# Patient Record
Sex: Male | Born: 1960 | Race: Black or African American | Hispanic: No | Marital: Single | Smoking: Current every day smoker
Health system: Southern US, Community
[De-identification: ages and names within clinical notes are randomized; demographics above are authoritative.]

## PROBLEM LIST (undated history)

## (undated) DIAGNOSIS — I10 Essential (primary) hypertension: Secondary | ICD-10-CM

## (undated) DIAGNOSIS — G459 Transient cerebral ischemic attack, unspecified: Secondary | ICD-10-CM

## (undated) DIAGNOSIS — H409 Unspecified glaucoma: Secondary | ICD-10-CM

## (undated) DIAGNOSIS — E119 Type 2 diabetes mellitus without complications: Secondary | ICD-10-CM

## (undated) HISTORY — PX: COLON SURGERY: SHX602

## (undated) HISTORY — PX: BACK SURGERY: SHX140

## (undated) HISTORY — PX: IMPLANTATION / PLACEMENT EPIDURAL NEUROSTIMULATOR ELECTRODES: SUR687

---

## 2014-12-20 ENCOUNTER — Encounter (HOSPITAL_COMMUNITY): Payer: Self-pay | Admitting: *Deleted

## 2014-12-20 ENCOUNTER — Emergency Department (HOSPITAL_COMMUNITY): Payer: Medicare Other

## 2014-12-20 ENCOUNTER — Observation Stay (HOSPITAL_COMMUNITY)
Admission: EM | Admit: 2014-12-20 | Discharge: 2014-12-22 | Disposition: A | Discharge status: Home / Self Care | Payer: Medicare Other | Attending: Family Medicine | Admitting: Family Medicine

## 2014-12-20 DIAGNOSIS — I129 Hypertensive chronic kidney disease with stage 1 through stage 4 chronic kidney disease, or unspecified chronic kidney disease: Secondary | ICD-10-CM | POA: Diagnosis not present

## 2014-12-20 DIAGNOSIS — Z79899 Other long term (current) drug therapy: Secondary | ICD-10-CM | POA: Insufficient documentation

## 2014-12-20 DIAGNOSIS — N189 Chronic kidney disease, unspecified: Secondary | ICD-10-CM | POA: Diagnosis not present

## 2014-12-20 DIAGNOSIS — Z791 Long term (current) use of non-steroidal anti-inflammatories (NSAID): Secondary | ICD-10-CM | POA: Insufficient documentation

## 2014-12-20 DIAGNOSIS — R4781 Slurred speech: Secondary | ICD-10-CM | POA: Diagnosis not present

## 2014-12-20 DIAGNOSIS — E119 Type 2 diabetes mellitus without complications: Secondary | ICD-10-CM

## 2014-12-20 DIAGNOSIS — Z6836 Body mass index (BMI) 36.0-36.9, adult: Secondary | ICD-10-CM | POA: Diagnosis not present

## 2014-12-20 DIAGNOSIS — R200 Anesthesia of skin: Secondary | ICD-10-CM | POA: Insufficient documentation

## 2014-12-20 DIAGNOSIS — R531 Weakness: Secondary | ICD-10-CM | POA: Insufficient documentation

## 2014-12-20 DIAGNOSIS — Z7902 Long term (current) use of antithrombotics/antiplatelets: Secondary | ICD-10-CM | POA: Diagnosis not present

## 2014-12-20 DIAGNOSIS — R262 Difficulty in walking, not elsewhere classified: Secondary | ICD-10-CM | POA: Insufficient documentation

## 2014-12-20 DIAGNOSIS — Z8673 Personal history of transient ischemic attack (TIA), and cerebral infarction without residual deficits: Secondary | ICD-10-CM | POA: Insufficient documentation

## 2014-12-20 DIAGNOSIS — E669 Obesity, unspecified: Secondary | ICD-10-CM | POA: Diagnosis not present

## 2014-12-20 DIAGNOSIS — F17200 Nicotine dependence, unspecified, uncomplicated: Secondary | ICD-10-CM | POA: Diagnosis not present

## 2014-12-20 DIAGNOSIS — G459 Transient cerebral ischemic attack, unspecified: Secondary | ICD-10-CM | POA: Diagnosis present

## 2014-12-20 DIAGNOSIS — Z7982 Long term (current) use of aspirin: Secondary | ICD-10-CM | POA: Insufficient documentation

## 2014-12-20 DIAGNOSIS — I10 Essential (primary) hypertension: Secondary | ICD-10-CM | POA: Diagnosis present

## 2014-12-20 DIAGNOSIS — Z7984 Long term (current) use of oral hypoglycemic drugs: Secondary | ICD-10-CM | POA: Diagnosis not present

## 2014-12-20 DIAGNOSIS — E785 Hyperlipidemia, unspecified: Secondary | ICD-10-CM | POA: Insufficient documentation

## 2014-12-20 DIAGNOSIS — H409 Unspecified glaucoma: Secondary | ICD-10-CM | POA: Diagnosis present

## 2014-12-20 DIAGNOSIS — I639 Cerebral infarction, unspecified: Secondary | ICD-10-CM

## 2014-12-20 DIAGNOSIS — G451 Carotid artery syndrome (hemispheric): Secondary | ICD-10-CM | POA: Insufficient documentation

## 2014-12-20 HISTORY — DX: Unspecified glaucoma: H40.9

## 2014-12-20 HISTORY — DX: Essential (primary) hypertension: I10

## 2014-12-20 HISTORY — DX: Type 2 diabetes mellitus without complications: E11.9

## 2014-12-20 HISTORY — DX: Transient cerebral ischemic attack, unspecified: G45.9

## 2014-12-20 LAB — COMPREHENSIVE METABOLIC PANEL
ALBUMIN: 3.7 g/dL (ref 3.5–5.0)
ALK PHOS: 65 U/L (ref 38–126)
ALT: 15 U/L — AB (ref 17–63)
ANION GAP: 12 (ref 5–15)
AST: 21 U/L (ref 15–41)
BILIRUBIN TOTAL: 0.5 mg/dL (ref 0.3–1.2)
BUN: 20 mg/dL (ref 6–20)
CO2: 25 mmol/L (ref 22–32)
CREATININE: 2.59 mg/dL — AB (ref 0.61–1.24)
Calcium: 9.6 mg/dL (ref 8.9–10.3)
Chloride: 97 mmol/L — ABNORMAL LOW (ref 101–111)
GFR calc Af Amer: 31 mL/min — ABNORMAL LOW (ref >60)
GFR calc non Af Amer: 26 mL/min — ABNORMAL LOW (ref >60)
GLUCOSE: 205 mg/dL — AB (ref 65–99)
Potassium: 3.9 mmol/L (ref 3.5–5.1)
SODIUM: 134 mmol/L — AB (ref 135–145)
TOTAL PROTEIN: 7.2 g/dL (ref 6.5–8.1)

## 2014-12-20 LAB — CBC
HCT: 40.9 % (ref 39.0–52.0)
HEMOGLOBIN: 13.9 g/dL (ref 13.0–17.0)
MCH: 30.7 pg (ref 26.0–34.0)
MCHC: 34 g/dL (ref 30.0–36.0)
MCV: 90.3 fL (ref 78.0–100.0)
PLATELETS: 265 10*3/uL (ref 150–400)
RBC: 4.53 MIL/uL (ref 4.22–5.81)
RDW: 12.6 % (ref 11.5–15.5)
WBC: 8.6 10*3/uL (ref 4.0–10.5)

## 2014-12-20 LAB — I-STAT TROPONIN, ED: Troponin i, poc: 0 ng/mL (ref 0.00–0.08)

## 2014-12-20 LAB — I-STAT CHEM 8, ED
BUN: 24 mg/dL — ABNORMAL HIGH (ref 6–20)
Calcium, Ion: 1.23 mmol/L (ref 1.12–1.23)
Chloride: 101 mmol/L (ref 101–111)
Creatinine, Ser: 2.6 mg/dL — ABNORMAL HIGH (ref 0.61–1.24)
Glucose, Bld: 207 mg/dL — ABNORMAL HIGH (ref 65–99)
HCT: 43 % (ref 39.0–52.0)
HEMOGLOBIN: 14.6 g/dL (ref 13.0–17.0)
POTASSIUM: 4 mmol/L (ref 3.5–5.1)
Sodium: 138 mmol/L (ref 135–145)
TCO2: 24 mmol/L (ref 0–100)

## 2014-12-20 LAB — DIFFERENTIAL
Basophils Absolute: 0 10*3/uL (ref 0.0–0.1)
Basophils Relative: 0 %
EOS PCT: 1 %
Eosinophils Absolute: 0 10*3/uL (ref 0.0–0.7)
LYMPHS ABS: 2.1 10*3/uL (ref 0.7–4.0)
LYMPHS PCT: 25 %
Monocytes Absolute: 0.5 10*3/uL (ref 0.1–1.0)
Monocytes Relative: 6 %
NEUTROS ABS: 5.9 10*3/uL (ref 1.7–7.7)
NEUTROS PCT: 68 %

## 2014-12-20 LAB — PROTIME-INR
INR: 1.06 (ref 0.00–1.49)
Prothrombin Time: 14 seconds (ref 11.6–15.2)

## 2014-12-20 LAB — CBG MONITORING, ED: GLUCOSE-CAPILLARY: 200 mg/dL — AB (ref 65–99)

## 2014-12-20 LAB — APTT: aPTT: 30 seconds (ref 24–37)

## 2014-12-20 MED ORDER — SODIUM CHLORIDE 0.9 % IV BOLUS (SEPSIS)
1000.0000 mL | Freq: Once | INTRAVENOUS | Status: AC
  Administered 2014-12-21: 1000 mL via INTRAVENOUS

## 2014-12-20 NOTE — ED Provider Notes (Signed)
CSN: [id number]     Arrival date & time 12/20/14  2150 History  By signing my name below, I, Wayne Lynn, attest that this documentation has been prepared under the direction and in the presence of [name], MD. Electronically Signed: Doreatha Lynn, ED Scribe. 12/20/2014. 11:42 PM.    Chief Complaint  Patient presents with  . Transient Ischemic Attack   The history is provided by the patient. No language interpreter was used.    HPI Comments: Wayne Lynn is a 54 y.o. male with h/o HTN, HLD, DM, TIA who presents to the Emergency Department complaining of two episodes of stroke-like symptoms, first occurring this afternoon at 1645, lasting 10 minutes, and recurring 2-3 hours later, lasting 30 minutes. He states during the first episode, he experienced right arm weakness and fell to the floor while sitting in a chair at rest. Family member reports that he also had right-sided facial droop, decreased strength in the right arm and inability to raise his right arm. He states complete resolution of the symptoms after 10 minutes had passed. The next event occurred 2-3 hours later with right-sided facial droop, right-sided arm and leg weakness. Family states that he also had slurred speech at this time. Pt states complete resolution of these symptoms after 30 minutes. He states h/o 2 prior TIA (last on 08/25/14), within a week of each other and pt was put on Plavix and ASA. He states no blockage was found in either work up. He endorses that he did not have complete resolution of prior right-sided facial symptoms, but has no residual weakness in his extremities. Pt is a current smoker. He has been taking his medications as prescribed. PCP is in [place] and he is also seen at the [facility] in [place].   Past Medical History  Diagnosis Date  . Hypertension   . Diabetes mellitus without complication (HCC)   . TIA (transient ischemic attack)   . Glaucoma    Past Surgical History  Procedure Laterality  Date  . Colon surgery    . Back surgery    . Implantation / placement epidural neurostimulator electrodes     History reviewed. No pertinent family history. Social History  Substance Use Topics  . Smoking status: Current Every Day Smoker  . Smokeless tobacco: None  . Alcohol Use: Yes    Review of Systems A complete 10 system review of systems was obtained and all systems are negative except as noted in the HPI and PMH.     Allergies  Review of patient's allergies indicates no known allergies.  Home Medications   Prior to Admission medications   Medication Sig Start Date End Date Taking? Authorizing Provider  amLODipine (NORVASC) 10 MG tablet Take 10 mg by mouth daily.   Yes Historical Provider, MD  aspirin EC 81 MG tablet Take 81 mg by mouth daily.   Yes Historical Provider, MD  brimonidine (ALPHAGAN) 0.2 % ophthalmic solution Place 1 drop into both eyes daily.   Yes Historical Provider, MD  clopidogrel (PLAVIX) 75 MG tablet Take 75 mg by mouth daily.   Yes Historical Provider, MD  dorzolamide-timolol (COSOPT) 22.3-6.8 MG/ML ophthalmic solution Place 2 drops into both eyes 2 (two) times daily.    Yes Historical Provider, MD  ibuprofen (ADVIL,MOTRIN) 200 MG tablet Take 200-400 mg by mouth every 6 (six) hours as needed for moderate pain.   Yes Historical Provider, MD  latanoprost (XALATAN) 0.005 % ophthalmic solution Place 1 drop into both eyes at bedtime.  Yes Historical Provider, MD  lisinopril-hydrochlorothiazide (PRINZIDE,ZESTORETIC) 20-25 MG tablet Take 1 tablet by mouth daily.   Yes Historical Provider, MD  metFORMIN (GLUCOPHAGE) 500 MG tablet Take 500 mg by mouth daily.   Yes Historical Provider, MD  metoprolol tartrate (LOPRESSOR) 25 MG tablet Take 25 mg by mouth 2 (two) times daily.   Yes Historical Provider, MD   BP 104/59 mmHg  Pulse 78  Temp(Src) 97.7 F (36.5 C) (Oral)  Resp 16  SpO2 95% Physical Exam  Constitutional: He is oriented to person, place, and time. He  appears well-developed and well-nourished.  HENT:  Head: Normocephalic and atraumatic.  Eyes: EOM are normal. Pupils are equal, round, and reactive to light.  Neck: Normal range of motion.  Cardiovascular: Normal rate, regular rhythm, normal heart sounds and intact distal pulses.   Pulmonary/Chest: Effort normal and breath sounds normal. No respiratory distress.  Lungs CTA bilaterally.   Abdominal: Soft. Bowel sounds are normal. He exhibits no distension. There is no tenderness.  Musculoskeletal: Normal range of motion.  Neurological: He is alert and oriented to person, place, and time.  5/5 strength in major muscle groups of  bilateral upper and lower extremities. Speech normal. No facial asymetry.   Skin: Skin is warm and dry.  Psychiatric: He has a normal mood and affect. Judgment normal.  Nursing note and vitals reviewed.  ED Course  Procedures (including critical care time) DIAGNOSTIC STUDIES: Oxygen Saturation is 95% on RA, adequate by my interpretation.    COORDINATION OF CARE: 11:32 PM Discussed treatment plan with pt at bedside and pt agreed to plan.   Labs Review Labs Reviewed  COMPREHENSIVE METABOLIC PANEL - Abnormal; Notable for the following:    Sodium 134 (*)    Chloride 97 (*)    Glucose, Bld 205 (*)    Creatinine, Ser 2.59 (*)    ALT 15 (*)    GFR calc non Af Amer 26 (*)    GFR calc Af Amer 31 (*)    All other components within normal limits  CBG MONITORING, ED - Abnormal; Notable for the following:    Glucose-Capillary 200 (*)    All other components within normal limits  I-STAT CHEM 8, ED - Abnormal; Notable for the following:    BUN 24 (*)    Creatinine, Ser 2.60 (*)    Glucose, Bld 207 (*)    All other components within normal limits  PROTIME-INR  APTT  CBC  DIFFERENTIAL  I-STAT TROPOININ, ED    Imaging Review Ct Head Wo Contrast  12/21/2014  CLINICAL DATA:  Right-sided numbness, symptoms have resolved. Dizziness. EXAM: CT HEAD WITHOUT CONTRAST  TECHNIQUE: Contiguous axial images were obtained from the base of the skull through the vertex without intravenous contrast. COMPARISON:  None. 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 Small lacunar infarct left thalamus. No evidence of territorial infarct. No intracranial hemorrhage, mass effect, or midline shift. No hydrocephalus. The basilar cisterns are patent. No intracranial fluid collection. Calvarium is intact. Included paranasal sinuses and mastoid air cells are well aerated. Postsurgical change in the right globe. 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 These are likely remote, however no prior exams are available for comparison. No evidence of territorial infarct. Electronically Signed   By: Rubye OaksMelanie  Ehinger M.D.   On: 12/21/2014 00:14   I have personally reviewed and evaluated these images and lab results as part of my medical decision-making.   EKG  Interpretation   Date/Time:  Sunday December 20 2014 22:00:39 EDT Ventricular Rate:  77 PR Interval:  168 QRS Duration: 83 QT Interval:  379 QTC Calculation: 429 R Axis:   29 Text Interpretation:  Sinus rhythm Borderline T wave abnormalities No old  tracing to compare Confirmed by BELFI  MD, MELANIE ([id number]) on 12/20/2014  10:05:29 PM      MDM   Final diagnoses:  Transient cerebral ischemia, unspecified transient cerebral ischemia type   Symptoms consistent with TIA. Already on plavix. Recent workup in June 2016. Given two episodes today and prominent debilitating features the pt will be placed on observation. Spoke with Dr Thad Ranger of neurology who will evaluate the pt. Agrees with observational admission  I, Cyana Shook M, personally performed the services described in this documentation. All medical record entries made by the scribe were at my direction and in my presence.  I have reviewed the chart and discharge instructions and agree that the record  reflects my personal performance and is accurate and complete. Samad Thon M.  12/21/2014. 10:56 PM.       [name], MD 12/21/14 2256

## 2014-12-20 NOTE — ED Notes (Signed)
Pt refusing IV 

## 2014-12-20 NOTE — ED Notes (Signed)
CBG 200 

## 2014-12-20 NOTE — ED Notes (Signed)
Pt to ED from home via [facility] c/o stroke-like symptoms. Hx of TIAs with last one in June. Pt was started on Plavix at that time. 2 hours pta pt c/o numbness to R arm and R side of face. First episode lasting 10mins then subsided; second episode lasting 30 mins then subsided. At present pt denies symtpoms.

## 2014-12-21 ENCOUNTER — Ambulatory Visit (HOSPITAL_COMMUNITY): Payer: Medicare Other

## 2014-12-21 DIAGNOSIS — E119 Type 2 diabetes mellitus without complications: Secondary | ICD-10-CM | POA: Diagnosis not present

## 2014-12-21 DIAGNOSIS — I10 Essential (primary) hypertension: Secondary | ICD-10-CM

## 2014-12-21 DIAGNOSIS — G459 Transient cerebral ischemic attack, unspecified: Secondary | ICD-10-CM

## 2014-12-21 DIAGNOSIS — G451 Carotid artery syndrome (hemispheric): Secondary | ICD-10-CM | POA: Insufficient documentation

## 2014-12-21 DIAGNOSIS — H409 Unspecified glaucoma: Secondary | ICD-10-CM | POA: Diagnosis present

## 2014-12-21 LAB — LIPID PANEL
CHOL/HDL RATIO: 7.6 ratio
CHOLESTEROL: 244 mg/dL — AB (ref 0–200)
HDL: 32 mg/dL — AB (ref >40)
LDL CALC: 164 mg/dL — AB (ref 0–99)
TRIGLYCERIDES: 242 mg/dL — AB (ref <150)
VLDL: 48 mg/dL — AB (ref 0–40)

## 2014-12-21 LAB — GLUCOSE, CAPILLARY: Glucose-Capillary: 144 mg/dL — ABNORMAL HIGH (ref 65–99)

## 2014-12-21 MED ORDER — PRAVASTATIN SODIUM 40 MG PO TABS: 40.0000 mg | ORAL_TABLET | Freq: Every day | ORAL | Status: DC

## 2014-12-21 MED ORDER — ASPIRIN-DIPYRIDAMOLE ER 25-200 MG PO CP12: 1.0000 | ORAL_CAPSULE | Freq: Two times a day (BID) | ORAL | Status: DC

## 2014-12-21 MED ORDER — PRAVASTATIN SODIUM 40 MG PO TABS
80.0000 mg | ORAL_TABLET | Freq: Every day | ORAL | Status: DC
  Administered 2014-12-21: 80 mg via ORAL
  Filled 2014-12-21: qty 2

## 2014-12-21 MED ORDER — METOPROLOL TARTRATE 25 MG PO TABS
25.0000 mg | ORAL_TABLET | Freq: Two times a day (BID) | ORAL | Status: DC
  Administered 2014-12-21 – 2014-12-22 (×3): 25 mg via ORAL
  Filled 2014-12-21 (×3): qty 1

## 2014-12-21 MED ORDER — LATANOPROST 0.005 % OP SOLN
1.0000 [drp] | Freq: Every day | OPHTHALMIC | Status: DC
  Administered 2014-12-21: 1 [drp] via OPHTHALMIC
  Filled 2014-12-21: qty 2.5

## 2014-12-21 MED ORDER — LISINOPRIL-HYDROCHLOROTHIAZIDE 20-25 MG PO TABS: 1.0000 | ORAL_TABLET | Freq: Every day | ORAL | Status: DC

## 2014-12-21 MED ORDER — AMLODIPINE BESYLATE 10 MG PO TABS
10.0000 mg | ORAL_TABLET | Freq: Every day | ORAL | Status: DC
  Administered 2014-12-21 – 2014-12-22 (×2): 10 mg via ORAL
  Filled 2014-12-21 (×2): qty 1

## 2014-12-21 MED ORDER — STROKE: EARLY STAGES OF RECOVERY BOOK: Freq: Once | Status: DC

## 2014-12-21 MED ORDER — ASPIRIN 325 MG PO TABS
325.0000 mg | ORAL_TABLET | Freq: Every day | ORAL | Status: DC
  Administered 2014-12-21: 325 mg via ORAL
  Filled 2014-12-21: qty 1

## 2014-12-21 MED ORDER — DORZOLAMIDE HCL-TIMOLOL MAL 2-0.5 % OP SOLN
2.0000 [drp] | Freq: Two times a day (BID) | OPHTHALMIC | Status: DC
  Administered 2014-12-21 – 2014-12-22 (×3): 2 [drp] via OPHTHALMIC
  Filled 2014-12-21: qty 10

## 2014-12-21 MED ORDER — ACETAMINOPHEN 325 MG PO TABS
650.0000 mg | ORAL_TABLET | Freq: Every day | ORAL | Status: DC
  Administered 2014-12-21: 650 mg via ORAL
  Filled 2014-12-21: qty 2

## 2014-12-21 MED ORDER — HYDROCHLOROTHIAZIDE 25 MG PO TABS
25.0000 mg | ORAL_TABLET | Freq: Every day | ORAL | Status: DC
Start: 2014-12-21 — End: 2014-12-21
  Administered 2014-12-21: 25 mg via ORAL
  Filled 2014-12-21: qty 1

## 2014-12-21 MED ORDER — SODIUM CHLORIDE 0.9 % IV SOLN: 250.0000 mL | INTRAVENOUS | Status: DC | PRN

## 2014-12-21 MED ORDER — LISINOPRIL 10 MG PO TABS
10.0000 mg | ORAL_TABLET | Freq: Every day | ORAL | Status: DC
  Administered 2014-12-22: 10 mg via ORAL
  Filled 2014-12-21: qty 1

## 2014-12-21 MED ORDER — SENNOSIDES-DOCUSATE SODIUM 8.6-50 MG PO TABS
1.0000 | ORAL_TABLET | Freq: Every evening | ORAL | Status: DC | PRN
Start: 2014-12-21 — End: 2014-12-22

## 2014-12-21 MED ORDER — ASPIRIN 300 MG RE SUPP: 300.0000 mg | Freq: Every day | RECTAL | Status: DC

## 2014-12-21 MED ORDER — CLOPIDOGREL BISULFATE 75 MG PO TABS
75.0000 mg | ORAL_TABLET | Freq: Every day | ORAL | Status: DC
  Administered 2014-12-21: 75 mg via ORAL
  Filled 2014-12-21: qty 1

## 2014-12-21 MED ORDER — BRIMONIDINE TARTRATE 0.2 % OP SOLN
1.0000 [drp] | Freq: Every day | OPHTHALMIC | Status: DC
  Administered 2014-12-21 – 2014-12-22 (×2): 1 [drp] via OPHTHALMIC
  Filled 2014-12-21: qty 5

## 2014-12-21 MED ORDER — ENOXAPARIN SODIUM 40 MG/0.4ML ~~LOC~~ SOLN
40.0000 mg | SUBCUTANEOUS | Status: DC
  Administered 2014-12-21 – 2014-12-22 (×2): 40 mg via SUBCUTANEOUS
  Filled 2014-12-21 (×2): qty 0.4

## 2014-12-21 MED ORDER — LISINOPRIL 20 MG PO TABS
20.0000 mg | ORAL_TABLET | Freq: Every day | ORAL | Status: DC
  Administered 2014-12-21: 20 mg via ORAL
  Filled 2014-12-21: qty 1

## 2014-12-21 MED ORDER — SODIUM CHLORIDE 0.9 % IJ SOLN: 3.0000 mL | Freq: Two times a day (BID) | INTRAMUSCULAR | Status: DC

## 2014-12-21 MED ORDER — SODIUM CHLORIDE 0.9 % IJ SOLN: 3.0000 mL | INTRAMUSCULAR | Status: DC | PRN

## 2014-12-21 MED ORDER — ASPIRIN-DIPYRIDAMOLE ER 25-200 MG PO CP12
1.0000 | ORAL_CAPSULE | Freq: Every day | ORAL | Status: DC
  Administered 2014-12-21: 1 via ORAL
  Filled 2014-12-21: qty 1

## 2014-12-21 NOTE — Progress Notes (Signed)
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-note that this is not compatible with MRI  Admitted to the hospital earlier today with slurred speech facial numbness which has spontaneously resolved  On exam no deficit no further issues Power 5/5, reflexes 2/3, Speech is clear external ocular movements are intact Sensory deferred   Note that he has creatinine 2.5 we are still awaiting records however in the interim I would discontinue his HCTZ and drop his dose of lisinopril from 20-10 Metformin is relatively contraindicated given renal insufficiency He states compliance on his other medications We will await further workup including echo Carotids have not been ordered as where waiting Follow A1c, follow lipid panel  Neurology confirms CT scan to be done tomorrow morning , not today as 24-hour should lapse between CT scans to denote any further change   Pleas KochJai Bless Belshe, MD [facility] [phone]) [phone]

## 2014-12-21 NOTE — Progress Notes (Signed)
PT Cancellation Note  Patient Details Name: August AlbinoLemuel E Duchene MRN: [id number] DOB: March 21, 1960   Cancelled Treatment:    Reason Eval/Treat Not Completed: Noted order to begin 12/22/14   Noted pt on observation, second head CT pending. Will follow-up later today if MD updates order to be completed today 10/17.   Joseangel Nettleton 12/21/2014, 8:23 AM  Pager [phone]

## 2014-12-21 NOTE — Evaluation (Signed)
Physical Therapy Evaluation and Discharge Patient Details Name: Wayne Lynn MRN: [id number] DOB: September 25, 1960 Today's Date: 12/21/2014   History of Present Illness  Presented to ED after 2 episodes of Rt sided weakness and numbness (each resolved after minutes). CT head + bil lacunar infarcts of basal ganglia (age indeterminate) PMHx- multiple TIAs, DM    Clinical Impression  Patient evaluated by Physical Therapy with no further acute PT needs identified. At baseline, pt has an antalgic gait pattern with incr lateral flexion of torso. Patient with no indication of balance deficits.  PT is signing off. Thank you for this referral.     Follow Up Recommendations No PT follow up    Equipment Recommendations  None recommended by PT    Recommendations for Other Services       Precautions / Restrictions Precautions Precautions: None      Mobility  Bed Mobility                  Transfers Overall transfer level: Independent Equipment used: None             General transfer comment: multiple repetitions  Ambulation/Gait Ambulation/Gait assistance: Modified independent (Device/Increase time) Ambulation Distance (Feet): 500 Feet Assistive device: None Gait Pattern/deviations: Step-through pattern;Decreased stride length;Wide base of support (incr lateral shift of shoulders) Gait velocity: functional Gait velocity interpretation: Below normal speed for age/gender General Gait Details: no cane available and pt states "no problem, I can walk without it" When asked why he uses cane he reports due to decr balance (although earlier denied any problems with balance);   Stairs Stairs: Yes Stairs assistance: Modified independent (Device/Increase time) Stair Management: One rail Right;One rail Left;Alternating pattern;Forwards Number of Stairs: 5    Wheelchair Mobility    Modified Rankin (Stroke Patients Only) Modified Rankin (Stroke Patients Only) Pre-Morbid  Rankin Score: No symptoms Modified Rankin: No symptoms     Balance Overall balance assessment: Modified Independent                               Standardized Balance Assessment Standardized Balance Assessment : Dynamic Gait Index   Dynamic Gait Index Level Surface: Mild Impairment Change in Gait Speed: Mild Impairment Gait with Horizontal Head Turns: Normal Gait with Vertical Head Turns: Normal Gait and Pivot Turn: Normal Step Over Obstacle: Mild Impairment Step Around Obstacles: Normal Steps: Mild Impairment Total Score: 20       Pertinent Vitals/Pain Pain Assessment: No/denies pain    Home Living Family/patient expects to be discharged to:: Private residence Living Arrangements: Spouse/significant other Available Help at Discharge: Family Type of Home: House Home Access: Stairs to enter Entrance Stairs-Rails: Doctor, general practice of Steps: 3 Home Layout: Able to live on main level with bedroom/bathroom Home Equipment: Walker - 2 wheels;Cane - single point;Shower seat - built in;Grab bars - tub/shower;Wheelchair - Careers adviser (comment) (uses golf cart)      Prior Function Level of Independence: Independent with assistive device(s)         Comments: full disability from [employer]; likes golf     Hand Dominance        Extremity/Trunk Assessment   Upper Extremity Assessment: Overall WFL for tasks assessed;Defer to OT evaluation           Lower Extremity Assessment: Overall WFL for tasks assessed      Cervical / Trunk Assessment: Normal  Communication   Communication: No difficulties  Cognition Arousal/Alertness: Awake/alert  Behavior During Therapy: WFL for tasks assessed/performed Overall Cognitive Status: Within Functional Limits for tasks assessed                      General Comments      Exercises        Assessment/Plan    PT Assessment Patent does not need any further PT services  PT Diagnosis  Difficulty walking   PT Problem List    PT Treatment Interventions     PT Goals (Current goals can be found in the Care Plan section) Acute Rehab PT Goals PT Goal Formulation: All assessment and education complete, DC therapy    Frequency     Barriers to discharge        Co-evaluation               End of Session   Activity Tolerance: Patient tolerated treatment well Patient left: in chair;with call bell/phone within reach;with family/visitor present Nurse Communication: Mobility status;Other (comment) (transfer sheet sign-off started)    Functional Assessment Tool Used: clinical judgment Functional Limitation: Mobility: Walking and moving around Mobility: Walking and Moving Around Current Status [phone](G8978): At least 1 percent but less than 20 percent impaired, limited or restricted Mobility: Walking and Moving Around Goal Status [phone](G8979): At least 1 percent but less than 20 percent impaired, limited or restricted Mobility: Walking and Moving Around Discharge Status [phone](G8980): At least 1 percent but less than 20 percent impaired, limited or restricted    Time: 1244-1257 PT Time Calculation (min) (ACUTE ONLY): 13 min   Charges:   PT Evaluation $Initial PT Evaluation Tier I: 1 Procedure     PT G Codes:   PT G-Codes **NOT FOR INPATIENT CLASS** Functional Assessment Tool Used: clinical judgment Functional Limitation: Mobility: Walking and moving around Mobility: Walking and Moving Around Current Status ([phone]): At least 1 percent but less than 20 percent impaired, limited or restricted Mobility: Walking and Moving Around Goal Status [phone](G8979): At least 1 percent but less than 20 percent impaired, limited or restricted Mobility: Walking and Moving Around Discharge Status [phone](G8980): At least 1 percent but less than 20 percent impaired, limited or restricted    Tamikka Pilger 12/21/2014, 1:10 PM Pager [phone]

## 2014-12-21 NOTE — H&P (Signed)
[facility] Admission History and Physical       Wayne AlbinoLemuel E Codrington JYN:[id number] DOB: 1960/12/03 DOA: 12/20/2014  Referring physician: EDP PCP: No primary care provider on file.  Specialists:   Chief Complaint: Right Arm then Right Sided Weakness  HPI: Wayne Lynn is a 54 y.o. male with a history of TIAs, HTN, DM2 and Glaucoma who presents to the ED with complaints of 2 episodes of TIA symptoms today.  The first episode was with right arm weakness that lasted for about 20 minutes, then later in the day he had another episode of right sided weakness with slurring of his speech and facial numbness that lasted about 30 minutes.   He denied having any headache or dizziness.    He was evaluated in the ED and had a Ct scan of the Head which was negative for acute findings.   Neurology was consulted and he was seen by Dr. Thad Rangereynolds in the ED.     Of note he had TIA symptoms and [place] work[place]  The Medical records have been requested to review the previous workups.    Also Patient has a Spinal cord stimulator and can not undergo MRI studies, so a repeat CT scan of teh Head will be performed in the AM.     Review of Systems:  Constitutional: No Weight Loss, No Weight Gain, Night Sweats, Fevers, Chills, Dizziness, Light Headedness, Fatigue, or Generalized Weakness HEENT: No Headaches, Difficulty Swallowing,Tooth/Dental Problems,Sore Throat,  No Sneezing, Rhinitis, Ear Ache, Nasal Congestion, or Post Nasal Drip,  Cardio-vascular:  No Chest pain, Orthopnea, PND, Edema in Lower Extremities, Anasarca, Dizziness, Palpitations  Resp: No Dyspnea, No DOE, No Productive Cough, No Non-Productive Cough, No Hemoptysis, No Wheezing.    GI: No Heartburn, Indigestion, Abdominal Pain, Nausea, Vomiting, Diarrhea, Constipation, Hematemesis, Hematochezia, Melena, Change in Bowel Habits,  Loss of Appetite  GU: No  Dysuria, No Change in Color of Urine, No Urgency or Urinary Frequency, No Flank pain.  Musculoskeletal: No Joint Pain or Swelling, No Decreased Range of Motion, No Back Pain.  Neurologic: No Syncope, No Seizures, +Right Arm and Right Sided Weakness, Paresthesia, Vision Disturbance or Loss, No Diplopia, No Vertigo, No Difficulty Walking,  Skin: No Rash or Lesions. Psych: No Change in Mood or Affect, No Depression or Anxiety, No Memory loss, No Confusion, or Hallucinations   Past Medical History  Diagnosis Date  . Hypertension   . Diabetes mellitus without complication (HCC)   . TIA (transient ischemic attack)   . Glaucoma      Past Surgical History  Procedure Laterality Date  . Colon surgery    . Back surgery    . Implantation / placement epidural neurostimulator electrodes        Prior to Admission medications   Medication Sig Start Date End Date Taking? Authorizing Provider  amLODipine (NORVASC) 10 MG tablet Take 10 mg by mouth daily.   Yes Historical Provider, MD  aspirin EC 81 MG tablet Take 81 mg by mouth daily.   Yes Historical Provider, MD  brimonidine (ALPHAGAN) 0.2 % ophthalmic solution Place 1 drop into both eyes daily.   Yes Historical Provider, MD  clopidogrel (PLAVIX) 75 MG tablet Take 75 mg by mouth daily.   Yes Historical Provider, MD  dorzolamide-timolol (COSOPT) 22.3-6.8 MG/ML ophthalmic solution Place 2 drops into both eyes 2 (two) times daily.    Yes  Historical Provider, MD  ibuprofen (ADVIL,MOTRIN) 200 MG tablet Take 200-400 mg by mouth every 6 (six) hours as needed for moderate pain.   Yes Historical Provider, MD  latanoprost (XALATAN) 0.005 % ophthalmic solution Place 1 drop into both eyes at bedtime.   Yes Historical Provider, MD  lisinopril-hydrochlorothiazide (PRINZIDE,ZESTORETIC) 20-25 MG tablet Take 1 tablet by mouth daily.   Yes Historical Provider, MD  metFORMIN (GLUCOPHAGE) 500 MG tablet Take 500 mg by mouth daily.   Yes Historical Provider, MD    metoprolol tartrate (LOPRESSOR) 25 MG tablet Take 25 mg by mouth 2 (two) times daily.   Yes Historical Provider, MD     No Known Allergies    Social History:  reports that he has been smoking.  He does not have any smokeless tobacco history on file. He reports that he drinks alcohol. His drug history is not on file.     History reviewed. No pertinent family history.     Physical Exam:  GEN:  Pleasant Obese 54 y.o. African American male examined and in no acute distress; cooperative with exam Filed Vitals:   12/20/14 2345 12/21/14 0000 12/21/14 0015 12/21/14 0030  BP: 122/79  119/63 122/72  Pulse: 77 76 71 70  Temp:      TempSrc:      Resp: SpO2: 97% 95% 95% 92%   Blood pressure 122/72, pulse 70, temperature 97.7 F (36.5 C), temperature source Oral, resp. rate 18, SpO2 92 %. PSYCH: He is alert and oriented x4; does not appear anxious does not appear depressed; affect is normal HEENT: Normocephalic and Atraumatic, Mucous membranes pink; PERRLA; EOM intact; Fundi:  Benign;  No scleral icterus, Nares: Patent, Oropharynx: Clear, Fair Dentition,    Neck:  FROM, No Cervical Lymphadenopathy nor Thyromegaly or Carotid Bruit; No JVD; Breasts:: Not examined CHEST WALL: No tenderness CHEST: Normal respiration, clear to auscultation bilaterally HEART: Regular rate and rhythm; no murmurs rubs or gallops BACK: No kyphosis or scoliosis; No CVA tenderness ABDOMEN: Positive Bowel Sounds, Obese, Soft Non-Tender, No Rebound or Guarding; No Masses, No Organomegaly. Rectal Exam: Not done EXTREMITIES: No Cyanosis, Clubbing, or Edema; No Ulcerations. Genitalia: not examined PULSES: 2+ and symmetric SKIN: Normal hydration no rash or ulceration CNS:  Alert and Oriented x 4, No Focal Deficits Mental Status:  Alert, Oriented, Thought Content Appropriate. Speech Fluent without evidence of Aphasia. Able to follow 3 step commands without difficulty.  In No obvious pain.   Cranial Nerves:   II: Discs flat bilaterally; Visual fields Intact, Pupils equal and reactive.    III,IV, VI: Extra-ocular motions intact bilaterally    V,VII: smile symmetric, facial light touch sensation normal bilaterally    VIII: hearing intact bilaterally    IX,X: gag reflex present    XI: bilateral shoulder shrug    XII: midline tongue extension   Motor:  Right:  Upper extremity 5/5     Left:  Upper extremity 5/5     Right:  Lower extremity 5/5    Left:  Lower extremity 5/5     Tone and Bulk:  normal tone throughout; no atrophy noted   Sensory:  Pinprick and light touch intact throughout, bilaterally   Deep Tendon Reflexes: 2+ and symmetric throughout   Plantars/ Babinski:  Right: normal Left: normal    Cerebellar:  Finger to nose without difficulty.   Gait: deferred    Vascular: pulses palpable throughout    Labs on Admission:  Basic Metabolic Panel:  Recent Labs Lab 12/20/14 2210 12/20/14 2223  NA 134* 138  K 3.9 4.0  CL 97* 101  CO2 25  --   GLUCOSE 205* 207*  BUN 20 24*  CREATININE 2.59* 2.60*  CALCIUM 9.6  --    Liver Function Tests:  Recent Labs Lab 12/20/14 2210  AST 21  ALT 15*  ALKPHOS 65  BILITOT 0.5  PROT 7.2  ALBUMIN 3.7   No results for input(s): LIPASE, AMYLASE in the last 168 hours. No results for input(s): AMMONIA in the last 168 hours. CBC:  Recent Labs Lab 12/20/14 2210 12/20/14 2223  WBC 8.6  --   NEUTROABS 5.9  --   HGB 13.9 14.6  HCT 40.9 43.0  MCV 90.3  --   PLT 265  --    Cardiac Enzymes: No results for input(s): CKTOTAL, CKMB, CKMBINDEX, TROPONINI in the last 168 hours.  BNP (last 3 results) No results for input(s): BNP in the last 8760 hours.  ProBNP (last 3 results) No results for input(s): PROBNP in the last 8760 hours.  CBG:  Recent Labs Lab 12/20/14 2208  GLUCAP 200*    Radiological Exams on Admission: Ct Head Wo Contrast  12/21/2014  CLINICAL DATA:  Right-sided numbness, symptoms have resolved. Dizziness.  EXAM: CT HEAD WITHOUT CONTRAST TECHNIQUE: Contiguous axial images were obtained from the base of the skull through the vertex without intravenous contrast. COMPARISON:  None. 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 Small lacunar infarct left thalamus. No evidence of territorial infarct. No intracranial hemorrhage, mass effect, or midline shift. No hydrocephalus. The basilar cisterns are patent. No intracranial fluid collection. Calvarium is intact. Included paranasal sinuses and mastoid air cells are well aerated. Postsurgical change in the right globe. 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 These are likely remote, however no prior exams are available for comparison. No evidence of territorial infarct. Electronically Signed   By: Rubye Oaks M.D.   On: 12/21/2014 00:14     EKG: Independently reviewed. Normal Sinus Rhythm Rate =77  No Acute Changes   Assessment/Plan:     54 y.o. male with  Principal Problem:   1.     TIA (transient ischemic attack)   Cardiac Monitoring   Neuro Checks   Repeat Ct Scan of Head in AM   2D ECHO in AM    Request records from previous TIA workup, Carotid US not ordered due to probable recent one done within 4-6 months   ASA Rx and Plavix     Active Problems:   2.    Hypertension   Continue Metoprolol, Amlodipine, and Lisinopril/HCTZ   Monitor BPs     3.    Diabetes mellitus without complication (HCC)   Hold Metformin Rx   SSI Coverage PRN   Check HbA1C in AM     4.    Glaucoma   Continue Xalatan and Cosopt Ophthalmic Drops     5.    DVT Prophylaxis   Lovenox   Code Status:     FULL CODE        Family Communication:   Family at Bedside     Disposition Plan:     Observation Status        Time spent:  24 Minutes      Ron Parker [facility] Pager [phone]   If 7AM -7PM Please Contact the Day Rounding Team MD for [facility]  If  7PM-7AM, Please  Contact Night-Floor Coverage  [email or web address] Password [email or web address] 12/21/2014, 12:48 AM     ADDENDUM:   Patient was seen and examined on 12/21/2014

## 2014-12-21 NOTE — Consult Note (Signed)
Referring Physician: Patria Mane    Chief Complaint: Right sided weakness, difficulty with speech  HPI: Wayne Lynn is an 54 y.o. male who reports that he was watching television (about 1630) this afternoon and had the acute onset of weakness in the RUE.  Symptoms lasted about 15 minutes and resolved spontaneously.  About three hours later he had the onset of weakness and numbness along the entire right side including the face.  Patient was unable to get his words out as well.  Symptoms lasted for about 30 minutes and resolved spontaneously.  Patient was concerned at that time and presented for evaluation. Patient had two episodes of TIA this summer about one week apart.  Was hospitalized for both.  Patient has been maintained on ASA and Plavix and reports compliance with both.    Date last known well: Date: 12/20/2014 Time last known well: Time: 19:30 tPA Given: No: Resolution of symptoms  Past Medical History  Diagnosis Date  . Hypertension   . Diabetes mellitus without complication (HCC)   . TIA (transient ischemic attack)   . Glaucoma     Past Surgical History  Procedure Laterality Date  . Colon surgery    . Back surgery    . Implantation / placement epidural neurostimulator electrodes      Family history: Mother died of stroke.  Unknown what father died from.    Social History:  reports that he has been smoking.  He does not have any smokeless tobacco history on file. He reports that he drinks alcohol. His drug history is not on file.  Allergies: No Known Allergies  Medications: I have reviewed the patient's current medications. Prior to Admission:  Prior to Admission medications   Medication Sig Start Date End Date Taking? Authorizing Provider  amLODipine (NORVASC) 10 MG tablet Take 10 mg by mouth daily.   Yes Historical Provider, MD  aspirin EC 81 MG tablet Take 81 mg by mouth daily.   Yes Historical Provider, MD  brimonidine (ALPHAGAN) 0.2 % ophthalmic solution Place 1  drop into both eyes daily.   Yes Historical Provider, MD  clopidogrel (PLAVIX) 75 MG tablet Take 75 mg by mouth daily.   Yes Historical Provider, MD  dorzolamide-timolol (COSOPT) 22.3-6.8 MG/ML ophthalmic solution Place 2 drops into both eyes 2 (two) times daily.    Yes Historical Provider, MD  ibuprofen (ADVIL,MOTRIN) 200 MG tablet Take 200-400 mg by mouth every 6 (six) hours as needed for moderate pain.   Yes Historical Provider, MD  latanoprost (XALATAN) 0.005 % ophthalmic solution Place 1 drop into both eyes at bedtime.   Yes Historical Provider, MD  lisinopril-hydrochlorothiazide (PRINZIDE,ZESTORETIC) 20-25 MG tablet Take 1 tablet by mouth daily.   Yes Historical Provider, MD  metFORMIN (GLUCOPHAGE) 500 MG tablet Take 500 mg by mouth daily.   Yes Historical Provider, MD  metoprolol tartrate (LOPRESSOR) 25 MG tablet Take 25 mg by mouth 2 (two) times daily.   Yes Historical Provider, MD    ROS: History obtained from the patient  General ROS: negative for - chills, fatigue, fever, night sweats, weight gain or weight loss Psychological ROS: negative for - behavioral disorder, hallucinations, memory difficulties, mood swings or suicidal ideation Ophthalmic ROS: negative for - blurry vision, double vision, eye pain or loss of vision ENT ROS: negative for - epistaxis, nasal discharge, oral lesions, sore throat, tinnitus or vertigo Allergy and Immunology ROS: negative for - hives or itchy/watery eyes Hematological and Lymphatic ROS: negative for - bleeding problems, bruising  or swollen lymph nodes Endocrine ROS: negative for - galactorrhea, hair pattern changes, polydipsia/polyuria or temperature intolerance Respiratory ROS: negative for - cough, hemoptysis, shortness of breath or wheezing Cardiovascular ROS: negative for - chest pain, dyspnea on exertion, edema or irregular heartbeat Gastrointestinal ROS: negative for - abdominal pain, diarrhea, hematemesis, nausea/vomiting or stool  incontinence Genito-Urinary ROS: negative for - dysuria, hematuria, incontinence or urinary frequency/urgency Musculoskeletal ROS: back pain Neurological ROS: as noted in HPI Dermatological ROS: negative for rash and skin lesion changes  Physical Examination: Blood pressure 122/72, pulse 70, temperature 97.7 F (36.5 C), temperature source Oral, resp. rate 18, SpO2 92 %.  GEN- NAD HEENT-  Normocephalic, no lesions, without obvious abnormality.  Normal external eye and conjunctiva.  Normal TM's bilaterally.  Normal auditory canals and external ears. Normal external nose, mucus membranes and septum.  Normal pharynx. Cardiovascular- S1, S2 normal, pulses palpable throughout   Lungs- chest clear, no wheezing, rales, normal symmetric air entry Abdomen- soft, non-tender; bowel sounds normal; no masses,  no organomegaly Extremities- no edema Lymph-no adenopathy palpable Musculoskeletal-no joint tenderness, deformity or swelling Skin-warm and dry, no hyperpigmentation, vitiligo, or suspicious lesions  Neurological Examination Mental Status: Alert, oriented, thought content appropriate.  Speech fluent without evidence of aphasia.  Able to follow 3 step commands without difficulty. Cranial Nerves: II: Discs flat bilaterally; Visual fields grossly normal, pupils equal, round, reactive to light and accommodation III,IV, VI: ptosis not present, extra-ocular motions intact bilaterally V,VII: smile symmetric, facial light touch sensation normal bilaterally VIII: hearing normal bilaterally IX,X: gag reflex present XI: bilateral shoulder shrug XII: midline tongue extension Motor: Right : Upper extremity   5/5    Left:     Upper extremity   5/5  Lower extremity   5/5     Lower extremity   5/5 Tone and bulk:normal tone throughout; no atrophy noted Sensory: Pinprick and light touch intact throughout, bilaterally Deep Tendon Reflexes: 2+ and symmetric throughout Plantars: Right: downgoing   Left:  downgoing Cerebellar: normal finger-to-nose and normal heel-to-shin testing bilaterally   Laboratory Studies:  Basic Metabolic Panel:  Recent Labs Lab 12/20/14 2210 12/20/14 2223  NA 134* 138  K 3.9 4.0  CL 97* 101  CO2 25  --   GLUCOSE 205* 207*  BUN 20 24*  CREATININE 2.59* 2.60*  CALCIUM 9.6  --     Liver Function Tests:  Recent Labs Lab 12/20/14 2210  AST 21  ALT 15*  ALKPHOS 65  BILITOT 0.5  PROT 7.2  ALBUMIN 3.7   No results for input(s): LIPASE, AMYLASE in the last 168 hours. No results for input(s): AMMONIA in the last 168 hours.  CBC:  Recent Labs Lab 12/20/14 2210 12/20/14 2223  WBC 8.6  --   NEUTROABS 5.9  --   HGB 13.9 14.6  HCT 40.9 43.0  MCV 90.3  --   PLT 265  --     Cardiac Enzymes: No results for input(s): CKTOTAL, CKMB, CKMBINDEX, TROPONINI in the last 168 hours.  BNP: Invalid input(s): POCBNP  CBG:  Recent Labs Lab 12/20/14 2208  GLUCAP 200*    Microbiology: No results found for this or any previous visit.  Coagulation Studies:  Recent Labs  12/20/14 2210  LABPROT 14.0  INR 1.06    Urinalysis: No results for input(s): COLORURINE, LABSPEC, PHURINE, GLUCOSEU, HGBUR, BILIRUBINUR, KETONESUR, PROTEINUR, UROBILINOGEN, NITRITE, LEUKOCYTESUR in the last 168 hours.  Invalid input(s): APPERANCEUR  Lipid Panel: No results found for: CHOL, TRIG, HDL, CHOLHDL, VLDL,  LDLCALC  HgbA1C: No results found for: HGBA1C  Urine Drug Screen:  No results found for: LABOPIA, COCAINSCRNUR, LABBENZ, AMPHETMU, THCU, LABBARB  Alcohol Level: No results for input(s): ETH in the last 168 hours.  Other results: EKG: sinus rhythm at 77 bpm.  Imaging: Ct Head Wo Contrast  12/21/2014  CLINICAL DATA:  Right-sided numbness, symptoms have resolved. Dizziness. EXAM: CT HEAD WITHOUT CONTRAST TECHNIQUE: Contiguous axial images were obtained from the base of the skull through the vertex without intravenous contrast. COMPARISON:  None. 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 Small lacunar infarct left thalamus. No evidence of territorial infarct. No intracranial hemorrhage, mass effect, or midline shift. No hydrocephalus. The basilar cisterns are patent. No intracranial fluid collection. Calvarium is intact. Included paranasal sinuses and mastoid air cells are well aerated. Postsurgical change in the right globe. 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 These are likely remote, however no prior exams are available for comparison. No evidence of territorial infarct. Electronically Signed   By: Rubye Oaks M.D.   On: 12/21/2014 00:14    Assessment: 54 y.o. male presenting after two episodes suggestive of left MCA TIA.  Patient now at baseline.  Has a history of TIA.  On ASA and Plavix at home.  Head CT personally reviewed and shows no acute changes.  Due to stimulator will not be able to have MRI this evening.  Further work and observation recommended.     Stroke Risk Factors - diabetes mellitus, family history, hypertension and smoking  Plan: 1. HgbA1c, fasting lipid panel 2. MRI, MRA  of the brain without contrast once stimulator addressed 3. PT consult, OT consult, Speech consult 4. Echocardiogram 5. Carotid dopplers 6. Prophylactic therapy-Continue ASA and Plavix 7. NPO until RN stroke swallow screen 8. Telemetry monitoring 9. Frequent neuro checks 10. Blood sugar control 11. Smoking cessation counseling  Case discussed with Dr. Constance Goltz, MD [facility] [phone] 12/21/2014, 12:49 AM

## 2014-12-21 NOTE — Progress Notes (Signed)
STROKE TEAM PROGRESS NOTE   HISTORY Wayne Lynn is an 54 y.o. male who reports that he was watching television (about 1630, confirmed with pt during rounds) this afternoon (10/16/216) and had the acute onset of weakness in the RUE (LKW). Symptoms lasted about 15 minutes and resolved spontaneously. About three hours later he had the onset of weakness and numbness along the entire right side including the face. Patient was unable to get his words out as well. Symptoms lasted for about 30 minutes and resolved spontaneously. Patient was concerned at that time and presented for evaluation. Patient had two episodes of TIA this summer about one week apart. Was hospitalized for both. Patient has been maintained on ASA and Plavix and reports compliance with both. Patient was not administered TPA secondary to resolution of symptoms. He was admitted for further evaluation and treatment.   SUBJECTIVE (INTERVAL HISTORY) His first cousin (stroke pt of Dr. Marlis EdelsonSethi's)  is at the bedside.  Patient up in the chair at the bedside. Overall he feels his condition is stable. He is not interested in research. He wants to be sure his kidneys are protected during stroke workup.   OBJECTIVE Temp:  [97.7 F (36.5 C)-98.6 F (37 C)] 98.2 F (36.8 C) (10/17 1104) Pulse Rate:  [59-81] 59 (10/17 1104) Cardiac Rhythm:  [-] Normal sinus rhythm (10/17 0700) Resp:  [16-24] 18 (10/17 1104) BP: (90-169)/(40-88) 139/76 mmHg (10/17 1104) SpO2:  [92 %-99 %] 99 % (10/17 1104) Weight:  [129.6 kg (285 lb 11.5 oz)] 129.6 kg (285 lb 11.5 oz) (10/17 0600)  CBC:   Recent Labs Lab 12/20/14 2210 12/20/14 2223  WBC 8.6  --   NEUTROABS 5.9  --   HGB 13.9 14.6  HCT 40.9 43.0  MCV 90.3  --   PLT 265  --     Basic Metabolic Panel:   Recent Labs Lab 12/20/14 2210 12/20/14 2223  NA 134* 138  K 3.9 4.0  CL 97* 101  CO2 25  --   GLUCOSE 205* 207*  BUN 20 24*  CREATININE 2.59* 2.60*  CALCIUM 9.6  --     Lipid  Panel:     Component Value Date/Time   CHOL 244* 12/21/2014 0433   TRIG 242* 12/21/2014 0433   HDL 32* 12/21/2014 0433   CHOLHDL 7.6 12/21/2014 0433   VLDL 48* 12/21/2014 0433   LDLCALC 164* 12/21/2014 0433   HgbA1c: No results found for: HGBA1C Urine Drug Screen: No results found for: LABOPIA, COCAINSCRNUR, LABBENZ, AMPHETMU, THCU, LABBARB    IMAGING  Ct Head Wo Contrast  12/21/2014  CLINICAL DATA:  Right-sided numbness, symptoms have resolved. Dizziness. EXAM: CT HEAD WITHOUT CONTRAST TECHNIQUE: Contiguous axial images were obtained from the base of the skull through the vertex without intravenous contrast. COMPARISON:  None. 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 Small lacunar infarct left thalamus. No evidence of territorial infarct. No intracranial hemorrhage, mass effect, or midline shift. No hydrocephalus. The basilar cisterns are patent. No intracranial fluid collection. Calvarium is intact. Included paranasal sinuses and mastoid air cells are well aerated. Postsurgical change in the right globe. 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 These are likely remote, however no prior exams are available for comparison. No evidence of territorial infarct. Electronically Signed   By: Rubye OaksMelanie  Ehinger M.D.   On: 12/21/2014 00:14    ABCD2 Score Age ? 60   no  BP ? 140/90 mmHg  no  Speech Disturbance without Weakness  +1  Duration of Symptoms  10-59 Minutes  +1  History of Diabetes yes  +1  TOTAL:  3 points    PHYSICAL EXAM Obese middle-aged male currently not in distress. . Afebrile. Head is nontraumatic. Neck is supple without bruit.    Cardiac exam no murmur or gallop. Lungs are clear to auscultation. Distal pulses are well felt. Neurological Exam ;  Awake  Alert oriented x 3. Normal speech and language.eye movements full without nystagmus.fundi were not visualized. Vision acuity and fields appear  normal. Hearing is normal. Palatal movements are normal. Face symmetric. Tongue midline. Normal strength, tone, reflexes and coordination. Normal sensation. Gait deferred. :  ASSESSMENT/PLAN Wayne Lynn is a 54 y.o. male with history of hypertension, diabetes, TIA and glaucoma presenting with right sided weakness, difficulty with speech. He did not receive IV t-PA due to resolution of symptoms.   Stroke vs TIA:    Resultant  Speech improved  Repeat CT in am to confirm/refute stroke given spinal cord stimulator  Carotid Doppler  ordered  2D Echo  pending   Check TCD to look at vasculature  LDL 164  HgbA1c pending  Lovenox 40 mg sq daily or VTE prophylaxis Diet heart healthy/carb modified Room service appropriate?: Yes; Fluid consistency:: Thin  aspirin 81 mg orally every day and clopidogrel 75 mg orally every day prior to admission, now on aspirin 325 mg orally every day and clopidogrel 75 mg orally every day. Change to  dipyridamole SR 250 mg/aspirin 25 mg orally twice a day for secondary stroke prevention. To prevent headache, most common side effect of Aggrenox, will start Aggrenox q hs x 2 weeks then increase Aggrenox to bid.  Until then, aspirin 81 mg q am x 2 weeks, then discontinue. May take Tylenol 650 mg 1 hr prior to Aggrenox for the first week, then discontinue. Orders written.  Patient counseled to be compliant with his antithrombotic medications  Ongoing aggressive stroke risk factor management  Therapy recommendations:  Pt OOB. Ok to do therapy today  Disposition:  pending   Hypertension  Stable  Hyperlipidemia  Home meds:  Pravastatin 40 mg per pt, i resumed in hospital  LDL 164, goal < 70  Consider increasing statin dose  Continue statin at discharge  Other Stroke Risk Factors  Cigarette smoker, advised to stop smoking  ETOH use  Obesity, Body mass index is 36.67 kg/(m^2).   Family hx stroke (mother)  Other Active  Problems  Glaucoma  Hospital day #   Rhoderick Moody [facility] See Amion for Pager information 12/21/2014 12:40 PM  I have personally examined this patient, reviewed notes, independently viewed imaging studies, participated in medical decision making and plan of care. I have made any additions or clarifications directly to the above note. Agree with note above. He presented with episodes of left hemispheric TIAs and remains at risk for recurrent stroke, TIA neurological worsening and needs ongoing stroke evaluation and aggressive risk factor modification. Recommend changing aspirin and Plavix to Aggrenox. Discussed possible side effects including headache and upset stomach with the patient and answered questions.  Delia Heady, MD Medical Director [facility] Pager: [phone] 12/21/2014 5:29 PM    To contact Stroke Continuity provider, please refer to [email or web address]. After hours, contact General Neurology

## 2014-12-21 NOTE — Progress Notes (Signed)
Received from ED, A&Ox4, oriented to room, unit, safety precautions & plan of care.  Denies pain.

## 2014-12-21 NOTE — Care Management Note (Signed)
Case Management Note  Patient Details  Name: August AlbinoLemuel E Hackel MRN: [id number] Date of Birth: 1960-09-16  Subjective/Objective:                    Action/Plan: Patient was admitted with slurred speech and facial numbness. Lives at home alone.  Will follow for discharge needs pending PT/OT evals and physician orders.  Expected Discharge Date:  12/22/14               Expected Discharge Plan:     In-House Referral:     Discharge planning Services     Post Acute Care Choice:    Choice offered to:     DME Arranged:    DME Agency:     HH Arranged:    HH Agency:     Status of Service:  In process, will continue to follow  Medicare Important Message Given:    Date Medicare IM Given:    Medicare IM give by:    Date Additional Medicare IM Given:    Additional Medicare Important Message give by:     If discussed at Long Length of Stay Meetings, dates discussed:    Additional Comments:  Anda KraftRobarge, Cassi Jenne C, RN 12/21/2014, 11:21 AM

## 2014-12-21 NOTE — Progress Notes (Signed)
Inpatient Diabetes Program Recommendations  AACE/ADA: New Consensus Statement on Inpatient Glycemic Control (2015)  Target Ranges:  Prepandial:   less than 140 mg/dL      Peak postprandial:   less than 180 mg/dL (1-2 hours)      Critically ill patients:  140 - 180 mg/dL   Review of Glycemic Control  Diabetes history: type 2 Outpatient Diabetes medications: Metformin Current orders for Inpatient glycemic control: none  Inpatient Diabetes Program Recommendations:  Correction (SSI): Please check cbg's tidwc and HS. Please order sensitive correction tidwc.  Please also check HgbA1C  Thank you Lenor CoffinAnn Mirca Yale, RN, MSN, CDE  Diabetes Inpatient Program Office: [phone] Pager: [phone] 8:00 am to 5:00 pm

## 2014-12-22 ENCOUNTER — Encounter (HOSPITAL_COMMUNITY): Payer: Self-pay | Admitting: *Deleted

## 2014-12-22 ENCOUNTER — Ambulatory Visit (HOSPITAL_BASED_OUTPATIENT_CLINIC_OR_DEPARTMENT_OTHER): Payer: Medicare Other

## 2014-12-22 ENCOUNTER — Observation Stay (HOSPITAL_COMMUNITY): Payer: Medicare Other

## 2014-12-22 DIAGNOSIS — G451 Carotid artery syndrome (hemispheric): Secondary | ICD-10-CM

## 2014-12-22 DIAGNOSIS — G458 Other transient cerebral ischemic attacks and related syndromes: Secondary | ICD-10-CM | POA: Diagnosis not present

## 2014-12-22 DIAGNOSIS — E119 Type 2 diabetes mellitus without complications: Secondary | ICD-10-CM | POA: Diagnosis not present

## 2014-12-22 DIAGNOSIS — H409 Unspecified glaucoma: Secondary | ICD-10-CM | POA: Diagnosis not present

## 2014-12-22 DIAGNOSIS — I639 Cerebral infarction, unspecified: Secondary | ICD-10-CM | POA: Diagnosis not present

## 2014-12-22 DIAGNOSIS — G459 Transient cerebral ischemic attack, unspecified: Secondary | ICD-10-CM

## 2014-12-22 LAB — HEMOGLOBIN A1C
HEMOGLOBIN A1C: 6.3 % — AB (ref 4.8–5.6)
MEAN PLASMA GLUCOSE: 134 mg/dL

## 2014-12-22 MED ORDER — METOPROLOL TARTRATE 50 MG PO TABS: 50.0000 mg | ORAL_TABLET | Freq: Two times a day (BID) | ORAL | Status: AC

## 2014-12-22 MED ORDER — PRAVASTATIN SODIUM 80 MG PO TABS: 80.0000 mg | ORAL_TABLET | Freq: Every day | ORAL | Status: DC

## 2014-12-22 MED ORDER — PRAVASTATIN SODIUM 80 MG PO TABS: 80.0000 mg | ORAL_TABLET | Freq: Every day | ORAL | Status: AC

## 2014-12-22 MED ORDER — SENNOSIDES-DOCUSATE SODIUM 8.6-50 MG PO TABS: 1.0000 | ORAL_TABLET | Freq: Every evening | ORAL | Status: AC | PRN

## 2014-12-22 MED ORDER — GLIMEPIRIDE 1 MG PO TABS: 1.0000 mg | ORAL_TABLET | Freq: Every day | ORAL | Status: AC

## 2014-12-22 MED ORDER — ASPIRIN-DIPYRIDAMOLE ER 25-200 MG PO CP12: 1.0000 | ORAL_CAPSULE | Freq: Every day | ORAL | Status: AC

## 2014-12-22 MED ORDER — ASPIRIN-DIPYRIDAMOLE ER 25-200 MG PO CP12: 1.0000 | ORAL_CAPSULE | Freq: Two times a day (BID) | ORAL | Status: AC

## 2014-12-22 MED ORDER — GLIMEPIRIDE 1 MG PO TABS: 1.0000 mg | ORAL_TABLET | Freq: Every day | ORAL | Status: DC

## 2014-12-22 NOTE — Progress Notes (Signed)
  Echocardiogram 2D Echocardiogram has been performed.  Wayne Lynn, Wayne Lynn 12/22/2014, 10:35 AM

## 2014-12-22 NOTE — Discharge Summary (Signed)
Physician Discharge Summary  August AlbinoLemuel E Fahy [id number] DOB: [birth year] DOA: 12/20/2014  PCP: No primary care provider on file.  Admit date: 12/20/2014 Discharge date: 12/22/2014  Time spent: 40 minutes  Recommendations for Outpatient Follow-up:  1. needs outpatient close follow-up with primary care 2. Change to this admission from aspirin and Plavix to Aggrenox 14 days once a day and then twice a day subsequently 3. Needs good blood pressure control and reevaluation of kidney function as an outpatient, suggest Chem-7 in about one week 4. D/c Met[place]mn 2/2 to AKI-started Amaryl 1 mg this admit 5. Started pravachol 80 this admit 6. 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-note that this is not compatible with MRI  Admitted to the hospital earlier today with slurred speech facial numbness which has spontaneously resolved  Hospital Course:   probable TIA Workup did not reveal anything on CT scan and CT scan repeat subsequent to discharge,,No bilateral stenosis on carotid Doppler  echocardiogramEF 6065% grade 1 diastolic dysfunction and there was no acute component  LDL elevated 164 total cholesterol triglycerides elevated as well to 244-242-started on Pravachol 80 mg  Note that he has creatinine 2.5  he has been prescribed these medications lisinopril  HCTZ and others by nephrology  Metformin is relatively contraindicated given renal insufficiency- transitioned to Amaryl 1mg  this admit As A1c 6.3, suggested he intensify weight loss and TLC approach  He states compliance on his other medications   Procedures:  MRbrain could not be obtainedsecondary to spinal cord stimulator   multiple CT scans did not confirm any type of infarct   Consultations:  neurology  Discharge Exam: Filed Vitals:   12/22/14 0950  BP: 147/90  Pulse: 68  Temp: 98.2 F (36.8 C)  Resp: 17   alert pleasant oriented no apparent distress  General: EOMI NCAT Cardiovascular: S1-S2 no murmur rub or gallop Respiratory: clinically clear  Neuro intact.  No fcoal deficit  Discharge Instructions    Current Discharge Medication List    CONTINUE these medications which have NOT CHANGED   Details  amLODipine (NORVASC) 10 MG tablet Take 10 mg by mouth daily.    aspirin EC 81 MG tablet Take 81 mg by mouth daily.    brimonidine (ALPHAGAN) 0.2 % ophthalmic solution Place 1 drop into both eyes daily.    clopidogrel (PLAVIX) 75 MG tablet Take 75 mg by mouth daily.    dorzolamide-timolol (COSOPT) 22.3-6.8 MG/ML ophthalmic solution Place 2 drops into both eyes 2 (two) times daily.     ibuprofen (ADVIL,MOTRIN) 200 MG tablet Take 200-400 mg by mouth every 6 (six) hours as needed for moderate pain.    latanoprost (XALATAN) 0.005 % ophthalmic solution Place 1 drop into both eyes at bedtime.    lisinopril-hydrochlorothiazide (PRINZIDE,ZESTORETIC) 20-25 MG tablet Take 1 tablet by mouth daily.    metFORMIN (GLUCOPHAGE) 500 MG tablet Take 500 mg by mouth daily.    metoprolol tartrate (  LOPRESSOR) 25 MG tablet Take 25 mg by mouth 2 (two) times daily.       No Known Allergies    The results of significant diagnostics from this hospitalization (including imaging, microbiology, ancillary and laboratory) are listed below for reference.    Significant Diagnostic  Studies: Ct Head Wo Contrast  12/22/2014  CLINICAL DATA:  Right-sided weakness for 3 day EXAM: CT HEAD WITHOUT CONTRAST TECHNIQUE: Contiguous axial images were obtained from the base of the skull through the vertex without intravenous contrast. COMPARISON:  12/20/2014 FINDINGS: Chronic ischemic changes in the left basal ganglia. No mass effect, midline shift, or acute hemorrhage. No mass effect, midline shift, or acute hemorrhage. IMPRESSION: No acute intracranial pathology. Electronically Signed   By: Jolaine Click M.D.   On: 12/22/2014 09:48   Ct Head Wo Contrast  12/21/2014  CLINICAL DATA:  Right-sided numbness, symptoms have resolved. Dizziness. EXAM: CT HEAD WITHOUT CONTRAST TECHNIQUE: Contiguous axial images were obtained from the base of the skull through the vertex without intravenous contrast. COMPARISON:  None. 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 Small lacunar infarct left thalamus. No evidence of territorial infarct. No intracranial hemorrhage, mass effect, or midline shift. No hydrocephalus. The basilar cisterns are patent. No intracranial fluid collection. Calvarium is intact. Included paranasal sinuses and mastoid air cells are well aerated. Postsurgical change in the right globe. 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 These are likely remote, however no prior exams are available for comparison. No evidence of territorial infarct. Electronically Signed   By: Rubye Oaks M.D.   On: 12/21/2014 00:14    Microbiology: No results found for this or any previous visit (from the past 240 hour(s)).   Labs: Basic Metabolic Panel:  Recent Labs Lab 12/20/14 2210 12/20/14 2223  NA 134* 138  K 3.9 4.0  CL 97* 101  CO2 25  --   GLUCOSE 205* 207*  BUN 20 24*  CREATININE 2.59* 2.60*  CALCIUM 9.6  --    Liver Function Tests:  Recent Labs Lab 12/20/14 2210  AST 21  ALT 15*  ALKPHOS 65  BILITOT  0.5  PROT 7.2  ALBUMIN 3.7   No results for input(s): LIPASE, AMYLASE in the last 168 hours. No results for input(s): AMMONIA in the last 168 hours. CBC:  Recent Labs Lab 12/20/14 2210 12/20/14 2223  WBC 8.6  --   NEUTROABS 5.9  --   HGB 13.9 14.6  HCT 40.9 43.0  MCV 90.3  --   PLT 265  --    Cardiac Enzymes: No results for input(s): CKTOTAL, CKMB, CKMBINDEX, TROPONINI in the last 168 hours. BNP: BNP (last 3 results) No results for input(s): BNP in the last 8760 hours.  ProBNP (last 3 results) No results for input(s): PROBNP in the last 8760 hours.  CBG:  Recent Labs Lab 12/20/14 2208 12/21/14 0653  GLUCAP 200* 144*       Signed:  Rhetta Mura  [facility] 12/22/2014, 1:37 PM

## 2014-12-22 NOTE — Progress Notes (Signed)
Discharge instructions reviewed with patient/family. RXs given. All questions answered at this time. Transportation provided by family.   Sim BoastHavy, RN

## 2014-12-22 NOTE — Progress Notes (Signed)
OT Cancellation Note  Patient Details Name: August AlbinoLemuel E Vandivier MRN: [id number] DOB: [birth year]   Cancelled Treatment:    Reason Eval/Treat Not Completed: Patient at procedure or test/ unavailable. OT to reattempt.  Pilar GrammesMathews, Bettie Swavely H 12/22/2014, 10:22 AM

## 2014-12-22 NOTE — Progress Notes (Signed)
1*PRELIMINARY RESULTS* Vascular Ultrasound Carotid Duplex (Doppler) has been completed.  Preliminary findings: Bilateral: No significant (1-39%) ICA stenosis. Antegrade vertebral flow.   Transcranial Doppler has been completed.    Farrel DemarkJill Eunice, RDMS, RVT  12/22/2014, 9:17 AM

## 2014-12-22 NOTE — Progress Notes (Signed)
STROKE TEAM PROGRESS NOTE   HISTORY Wayne Lynn is an 54 y.o. male who reports that he was watching television (about 1630, confirmed with pt during rounds) this afternoon (10/16/216) and had the acute onset of weakness in the RUE (LKW). Symptoms lasted about 15 minutes and resolved spontaneously. About three hours later he had the onset of weakness and numbness along the entire right side including the face. Patient was unable to get his words out as well. Symptoms lasted for about 30 minutes and resolved spontaneously. Patient was concerned at that time and presented for evaluation. Patient had two episodes of TIA this summer about one week apart. Was hospitalized for both. Patient has been maintained on ASA and Plavix and reports compliance with both. Patient was not administered TPA secondary to resolution of symptoms. He was admitted for further evaluation and treatment.   SUBJECTIVE (INTERVAL HISTORY) His first cousin (stroke pt of Dr. Marlis Edelson)  is at the bedside.  Patient up in the chair at the bedside. Overall he feels his condition is stable. He is not interested in research. He wants to be sure his kidneys are protected during stroke workup.   OBJECTIVE Temp:  [97.9 F (36.6 C)-98.5 F (36.9 C)] 98.5 F (36.9 C) (10/18 1457) Pulse Rate:  [57-75] 68 (10/18 1457) Cardiac Rhythm:  [-] Normal sinus rhythm (10/18 0700) Resp:  [17-20] 18 (10/18 1457) BP: (130-147)/(63-106) 144/106 mmHg (10/18 1457) SpO2:  [99 %-100 %] 100 % (10/18 1457)  CBC:   Recent Labs Lab 12/20/14 2210 12/20/14 2223  WBC 8.6  --   NEUTROABS 5.9  --   HGB 13.9 14.6  HCT 40.9 43.0  MCV 90.3  --   PLT 265  --     Basic Metabolic Panel:   Recent Labs Lab 12/20/14 2210 12/20/14 2223  NA 134* 138  K 3.9 4.0  CL 97* 101  CO2 25  --   GLUCOSE 205* 207*  BUN 20 24*  CREATININE 2.59* 2.60*  CALCIUM 9.6  --     Lipid Panel:     Component Value Date/Time   CHOL 244* 12/21/2014 0433   TRIG 242* 12/21/2014 0433   HDL 32* 12/21/2014 0433   CHOLHDL 7.6 12/21/2014 0433   VLDL 48* 12/21/2014 0433   LDLCALC 164* 12/21/2014 0433   HgbA1c:  Lab Results  Component Value Date   HGBA1C 6.3* 12/21/2014   Urine Drug Screen: No results found for: LABOPIA, COCAINSCRNUR, LABBENZ, AMPHETMU, THCU, LABBARB    IMAGING  Ct Head Wo Contrast  12/22/2014  CLINICAL DATA:  Right-sided weakness for 3 day EXAM: CT HEAD WITHOUT CONTRAST TECHNIQUE: Contiguous axial images were obtained from the base of the skull through the vertex without intravenous contrast. COMPARISON:  12/20/2014 FINDINGS: Chronic ischemic changes in the left basal ganglia. No mass effect, midline shift, or acute hemorrhage. No mass effect, midline shift, or acute hemorrhage. IMPRESSION: No acute intracranial pathology. Electronically Signed   By: Jolaine Click M.D.   On: 12/22/2014 09:48   Ct Head Wo Contrast  12/21/2014  CLINICAL DATA:  Right-sided numbness, symptoms have resolved. Dizziness. EXAM: CT HEAD WITHOUT CONTRAST TECHNIQUE: Contiguous axial images were obtained from the base of the skull through the vertex without intravenous contrast. COMPARISON:  None. 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 Small lacunar infarct left thalamus. No evidence of territorial infarct. No intracranial hemorrhage, mass effect, or midline shift. No hydrocephalus. The basilar cisterns are patent. No intracranial fluid  collection. Calvarium is intact. Included paranasal sinuses and mastoid air cells are well aerated. Postsurgical change in the right globe. 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 These are likely remote, however no prior exams are available for comparison. No evidence of territorial infarct. Electronically Signed   By: Rubye Oaks M.D.   On: 12/21/2014 00:14    ABCD2 Score Age ? 60   no  BP ? 140/90 mmHg  no  Speech Disturbance without  Weakness  +1  Duration of Symptoms  10-59 Minutes  +1  History of Diabetes yes  +1  TOTAL:  3 points    PHYSICAL EXAM Obese middle-aged male currently not in distress. . Afebrile. Head is nontraumatic. Neck is supple without bruit.    Cardiac exam no murmur or gallop. Lungs are clear to auscultation. Distal pulses are well felt. Neurological Exam ;  Awake  Alert oriented x 3. Normal speech and language.eye movements full without nystagmus.fundi were not visualized. Vision acuity and fields appear normal. Hearing is normal. Palatal movements are normal. Face symmetric. Tongue midline. Normal strength, tone, reflexes and coordination. Normal sensation. Gait deferred. :  ASSESSMENT/PLAN Wayne Lynn is a 54 y.o. male with history of hypertension, diabetes, TIA and glaucoma presenting with right sided weakness, difficulty with speech. He did not receive IV t-PA due to resolution of symptoms.   Stroke vs TIA:    Resultant  Speech improved  Repeat CT in am to confirm/refute stroke given spinal cord stimulator  Carotid Doppler  ordered  2D Echo  pending   Check TCD to look at vasculature  LDL 164  HgbA1c pending  Lovenox 40 mg sq daily or VTE prophylaxis Diet heart healthy/carb modified Room service appropriate?: Yes; Fluid consistency:: Thin  aspirin 81 mg orally every day and clopidogrel 75 mg orally every day prior to admission, now on aspirin 325 mg orally every day and clopidogrel 75 mg orally every day. Change to  dipyridamole SR 250 mg/aspirin 25 mg orally twice a day for secondary stroke prevention. To prevent headache, most common side effect of Aggrenox, will start Aggrenox q hs x 2 weeks then increase Aggrenox to bid.  Until then, aspirin 81 mg q am x 2 weeks, then discontinue. May take Tylenol 650 mg 1 hr prior to Aggrenox for the first week, then discontinue. Orders written.  Patient counseled to be compliant with his antithrombotic medications  Ongoing  aggressive stroke risk factor management  Therapy recommendations:  Pt OOB. Ok to do therapy today  Disposition:  pending   Hypertension  Stable  Hyperlipidemia  Home meds:  Pravastatin 40 mg per pt, i resumed in hospital  LDL 164, goal < 70  Consider increasing statin dose  Continue statin at discharge  Other Stroke Risk Factors  Cigarette smoker, advised to stop smoking  ETOH use  Obesity, Body mass index is 36.67 kg/(m^2).   Family hx stroke (mother)  Other Active Problems  Glaucoma  Hospital day #   [facility] See Amion for Pager information 12/22/2014 5:37 PM  I have personally examined this patient, reviewed notes, independently viewed imaging studies, participated in medical decision making and plan of care. I have made any additions or clarifications directly to the above note. Agree with note above. He presented with episodes of left hemispheric TIAs and remains at risk for recurrent stroke, TIA neurological worsening and needs ongoing stroke evaluation and aggressive risk factor modification. Recommend changing aspirin  and Plavix to Aggrenox. Discussed possible side effects including headache and upset stomach with the patient and answered questions.  Delia HeadyPramod Raeonna Milo, MD Medical Director [facility] Pager: [phone] 12/22/2014 5:37 PM    To contact Stroke Continuity provider, please refer to [email or web address]. After hours, contact General Neurology

## 2014-12-22 NOTE — Progress Notes (Signed)
OT Cancellation Note  Patient Details Name: Wayne Lynn MRN: [id number] DOB: 1960-09-27   Cancelled Treatment:    Reason Eval/Treat Not Completed: OT screened, no needs identified, will sign off. Per chart review and discussion with pt, pt is baseline with ADLs.   Pilar GrammesMathews, Revecca Nachtigal H 12/22/2014, 11:49 AM

## 2016-10-11 IMAGING — CT CT HEAD W/O CM
1 series · 16 of 30 positions shown, 20 images · non-contrast
Comparison: None.

CLINICAL DATA: Right-sided numbness, symptoms have resolved.
Dizziness.

EXAM:
CT HEAD WITHOUT CONTRAST
TECHNIQUE: Contiguous axial images were obtained from the base of the skull
through the vertex without intravenous contrast.

[Series 3: head 5.0 h30s · axial · 0.48mm/px · z∈[-143,+22]mm · 16 of 37 slices shown, 20 images]
[im 2/37  brain]
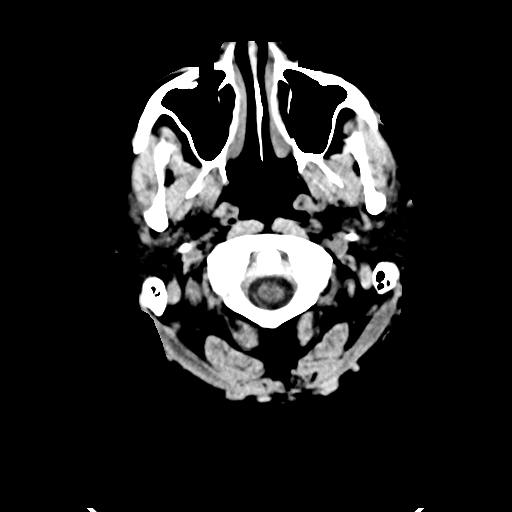
[im 2/37  bone]
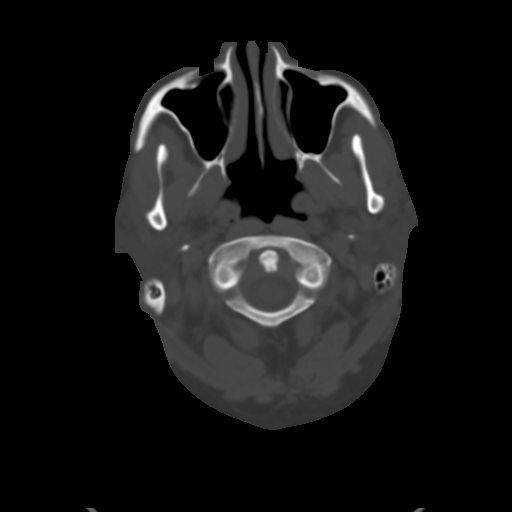
[im 4/37  brain]
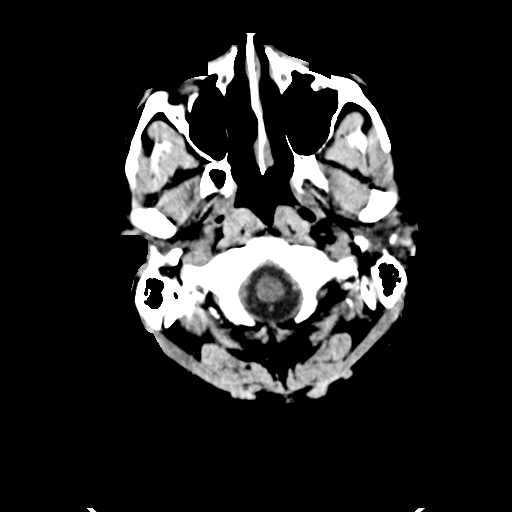
[im 7/37  brain]
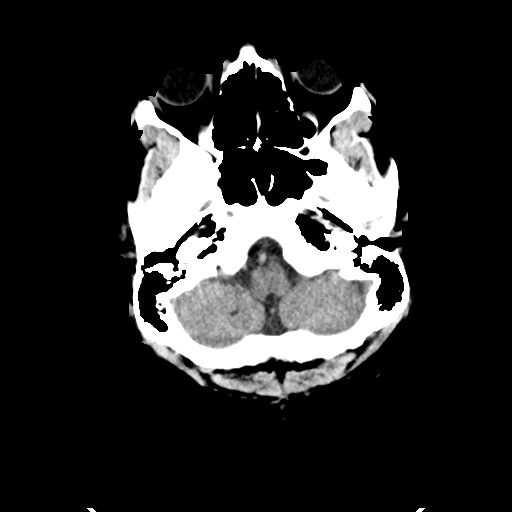
[im 9/37  brain]
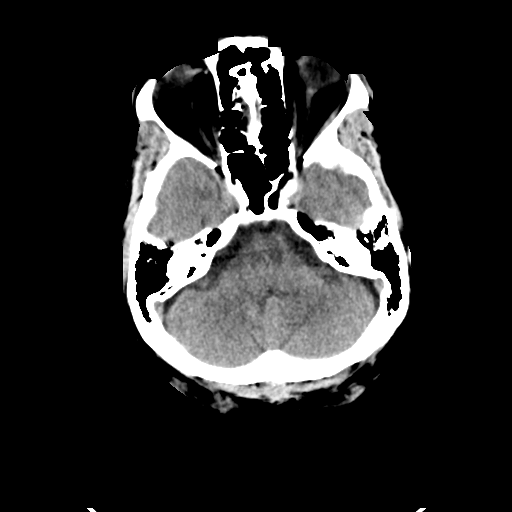
[im 10/37  brain]
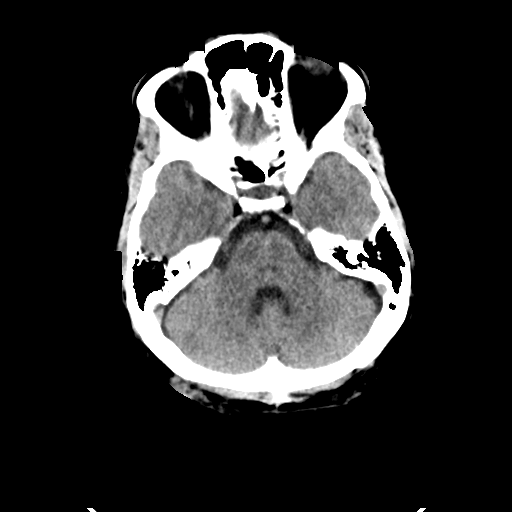
[im 10/37  bone]
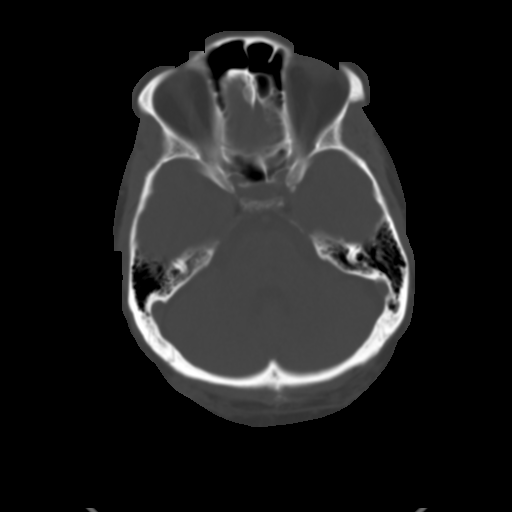
[im 13/37  brain]
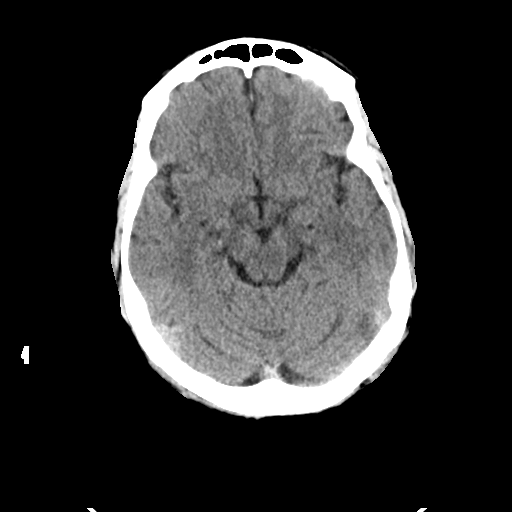
[im 15/37  brain]
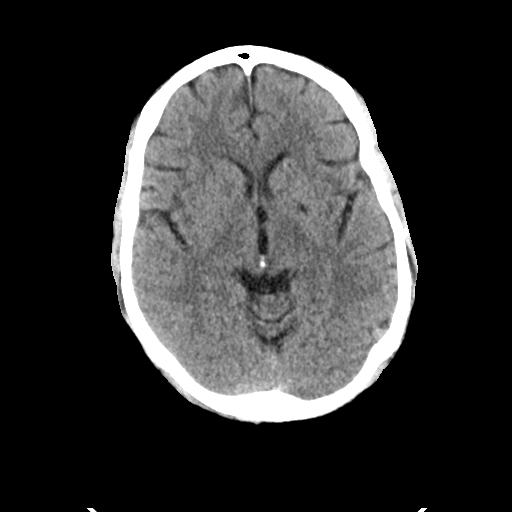
[im 18/37  brain]
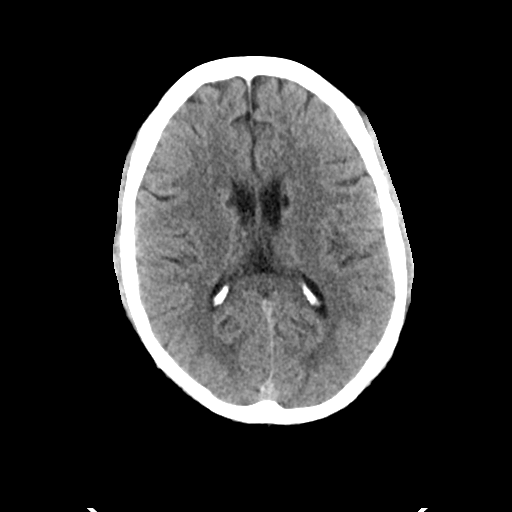
[im 19/37  brain]
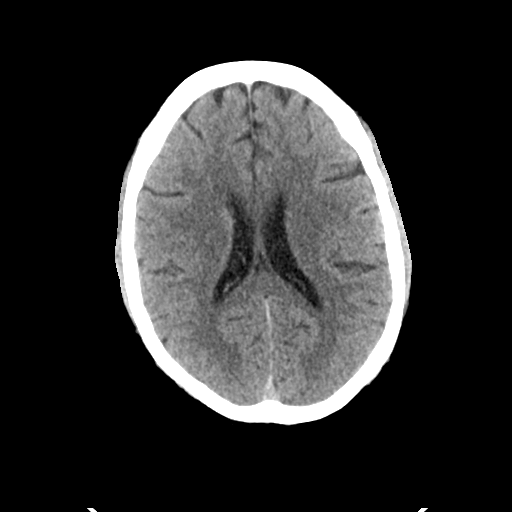
[im 19/37  bone]
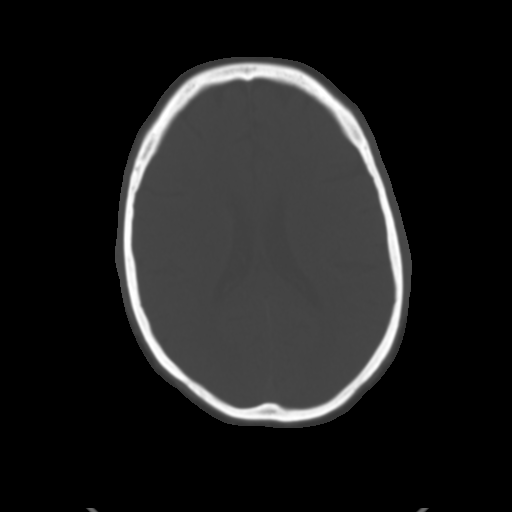
[im 22/37  brain]
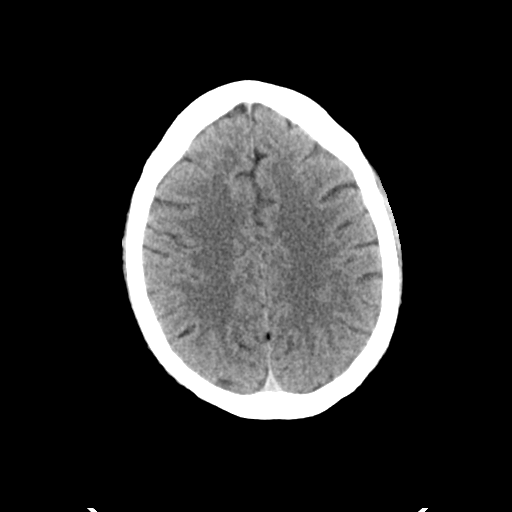
[im 24/37  brain]
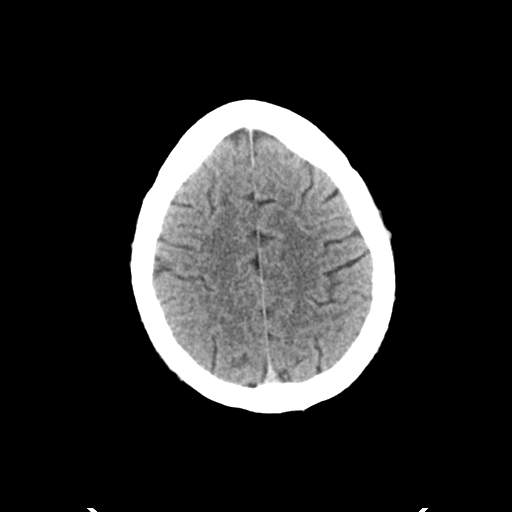
[im 27/37  brain]
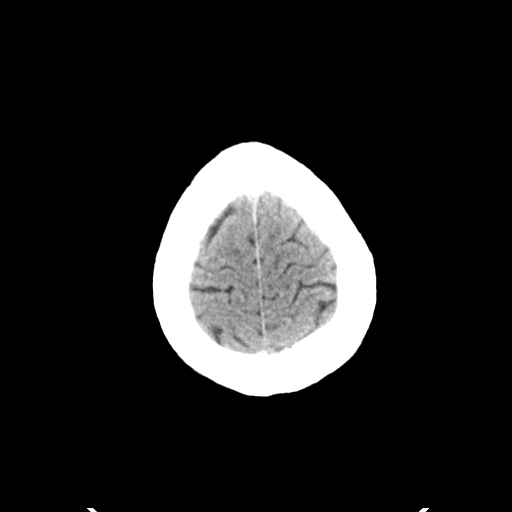
[im 28/37  brain]
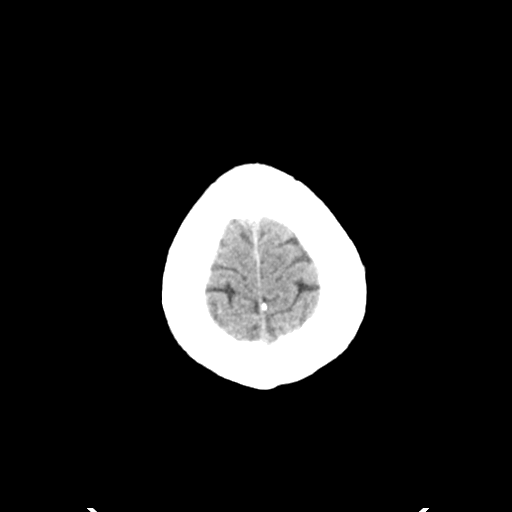
[im 28/37  bone]
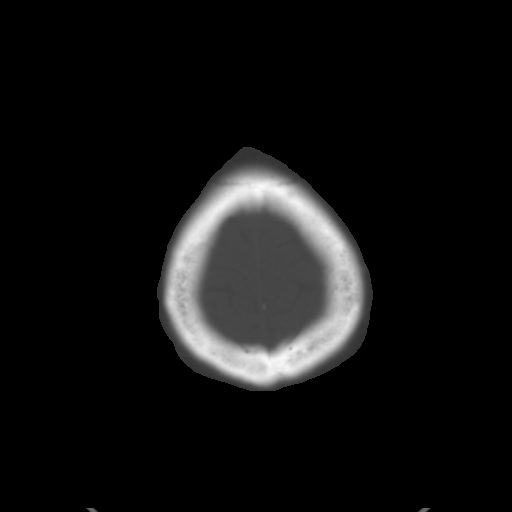
[im 30/37  brain]
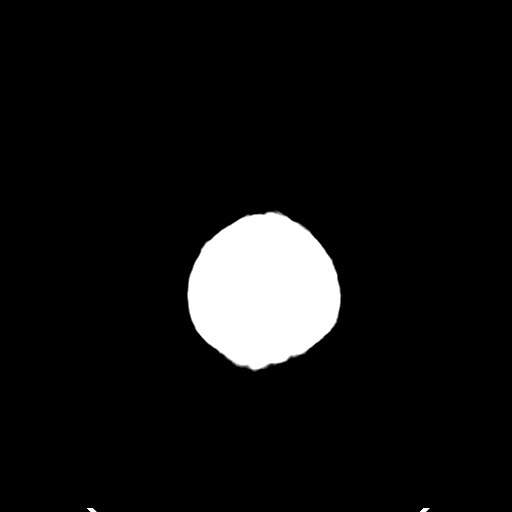
[im 33/37  brain]
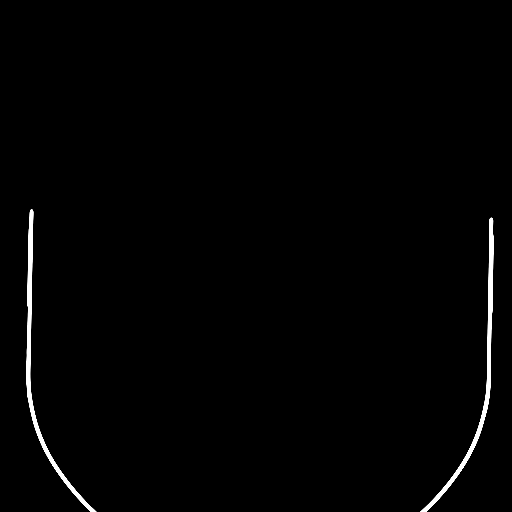
[im 35/37  brain]
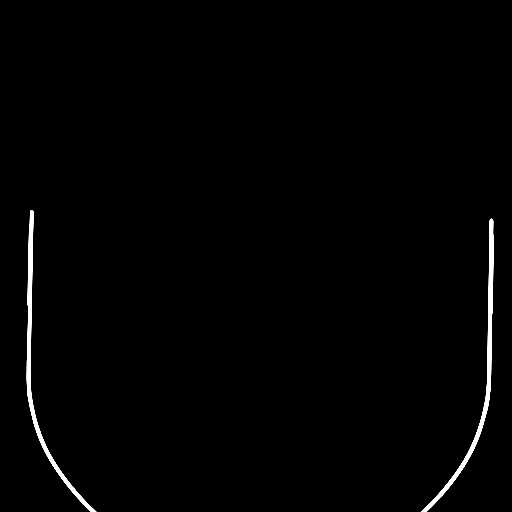

[16 of 30 positions shown; findings below may reference images not displayed]

FINDINGS: Multiple lacunar infarcts in both basal ganglia, left greater than
right. Small lacunar infarct left thalamus. No evidence of
territorial infarct. No intracranial hemorrhage, mass effect, or
midline shift. No hydrocephalus. The basilar cisterns are patent. No
intracranial fluid collection. Calvarium is intact. Included
paranasal sinuses and mastoid air cells are well aerated.
Postsurgical change in the right globe.
IMPRESSION: Lacunar infarcts in both basal ganglia, left greater than right.
These are likely remote, however no prior exams are available for
comparison. No evidence of territorial infarct.

## 2016-10-13 IMAGING — CT CT HEAD W/O CM
2 series · 16 of 30 positions shown, 20 images · non-contrast
Comparison: 12/20/2014

CLINICAL DATA: Right-sided weakness for 3 day

EXAM:
CT HEAD WITHOUT CONTRAST
TECHNIQUE: Contiguous axial images were obtained from the base of the skull
through the vertex without intravenous contrast.

[Series 201: head w/o, idose (1) · axial · non-contrast · 0.45mm/px · z∈[+369,+494]mm · 13 of 31 slices shown, 17 images]
[im 3/31  brain]
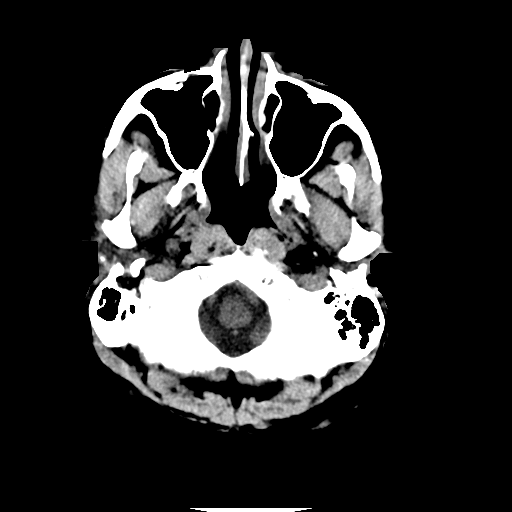
[im 3/31  bone]
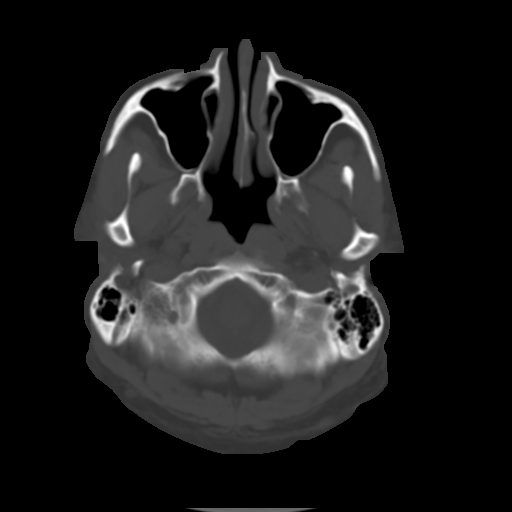
[im 5/31  brain]
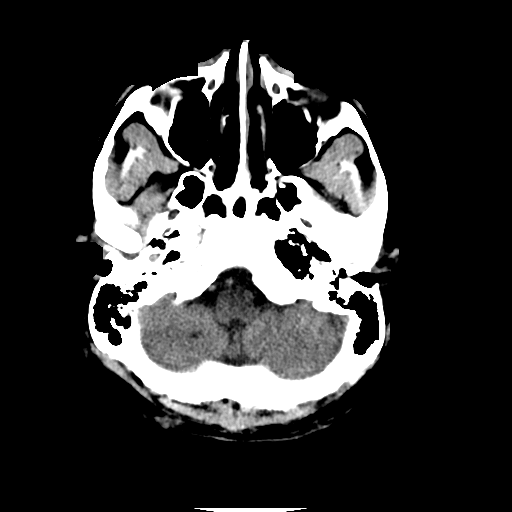
[im 7/31  brain]
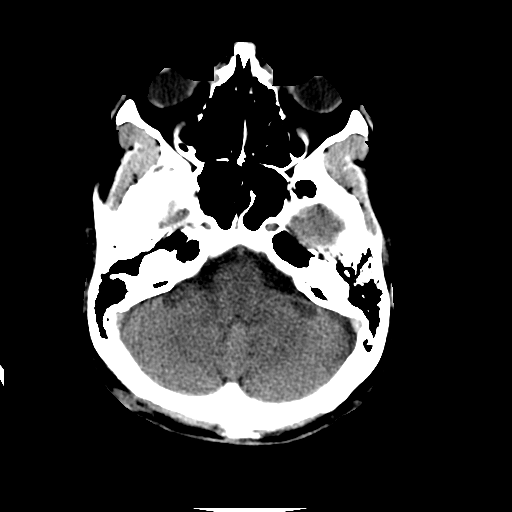
[im 9/31  brain]
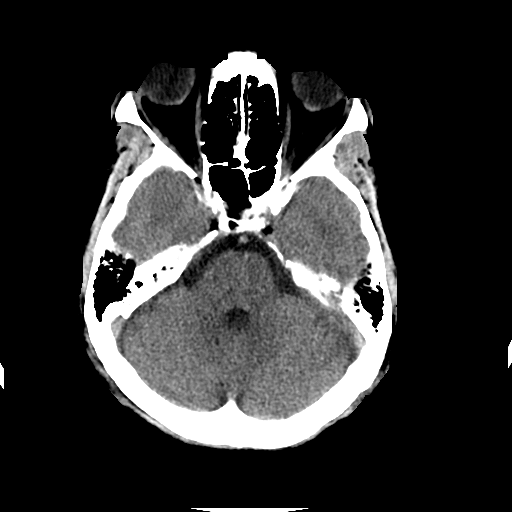
[im 11/31  brain]
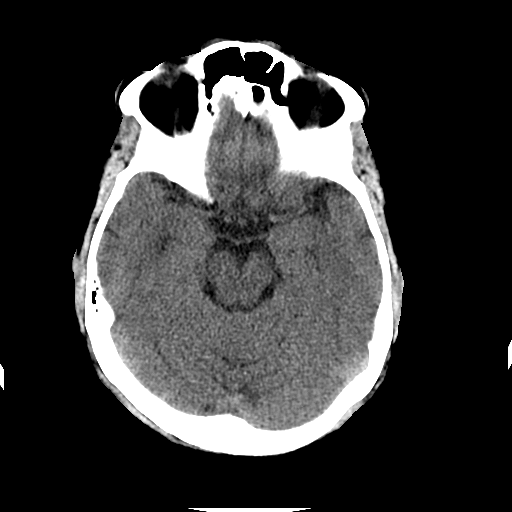
[im 11/31  bone]
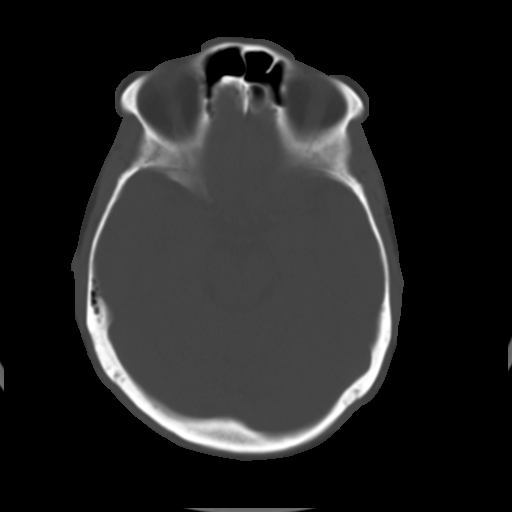
[im 13/31  brain]
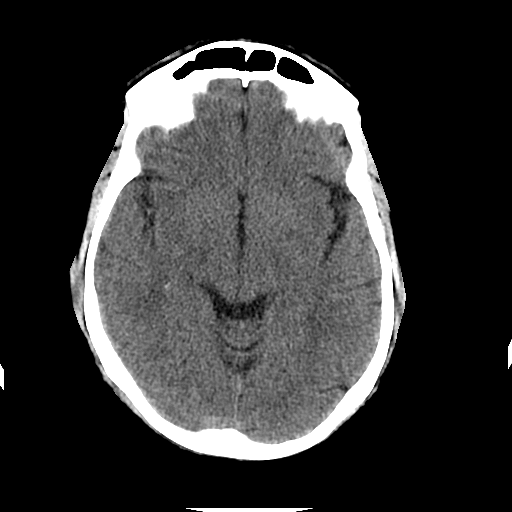
[im 16/31  brain]
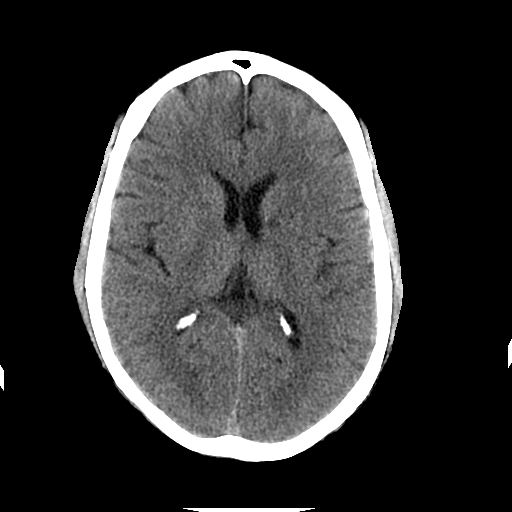
[im 18/31  brain]
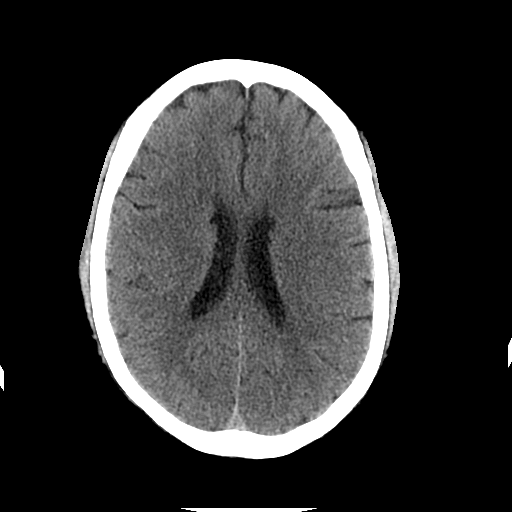
[im 20/31  brain]
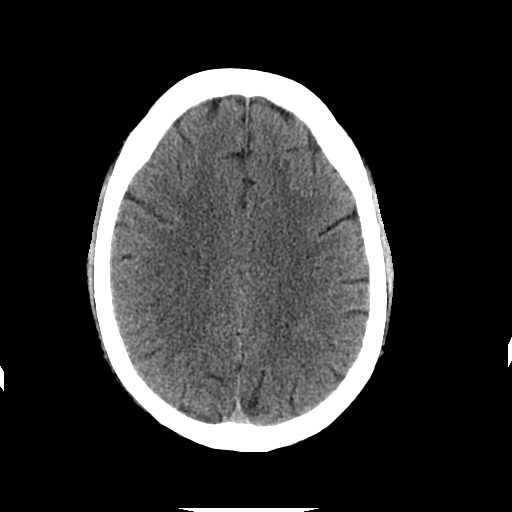
[im 20/31  bone]
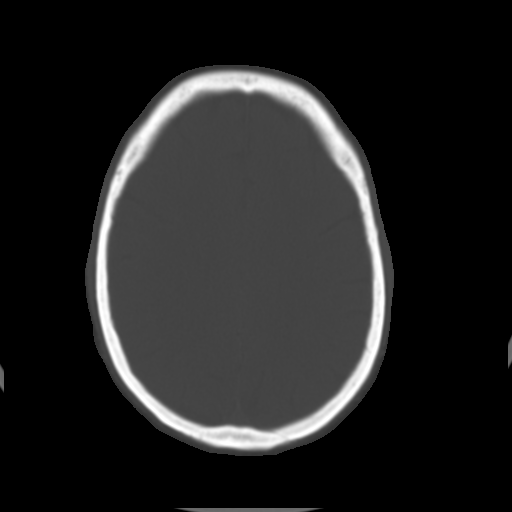
[im 22/31  brain]
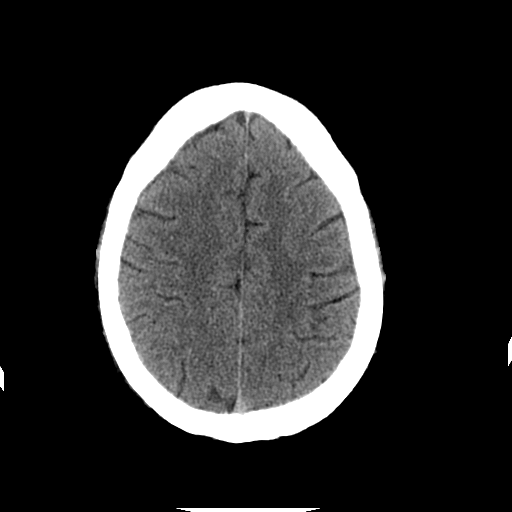
[im 24/31  brain]
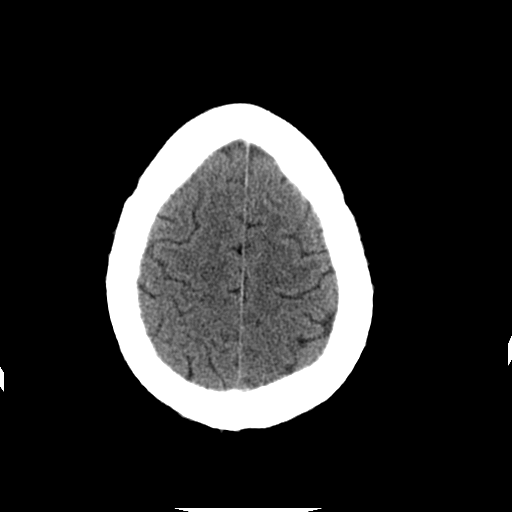
[im 26/31  brain]
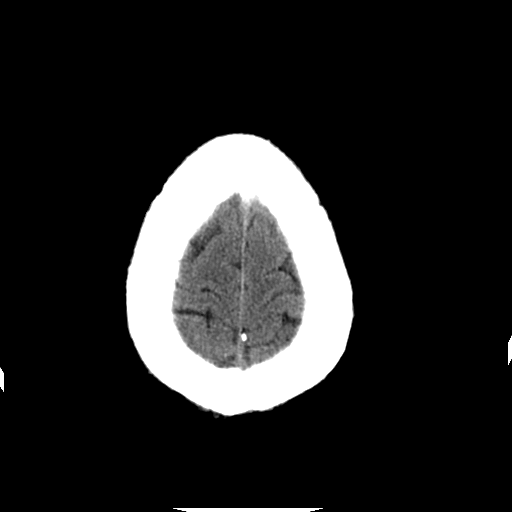
[im 28/31  brain]
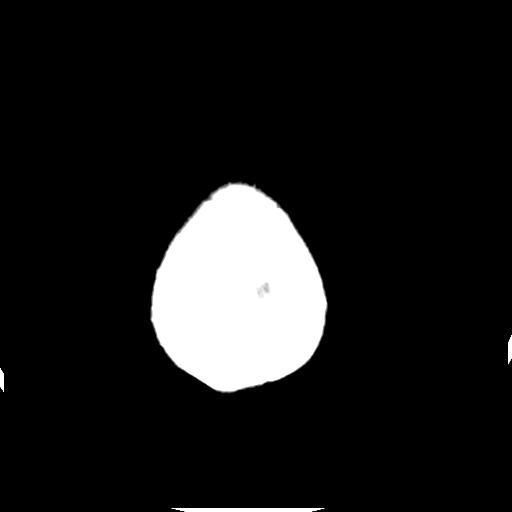
[im 28/31  bone]
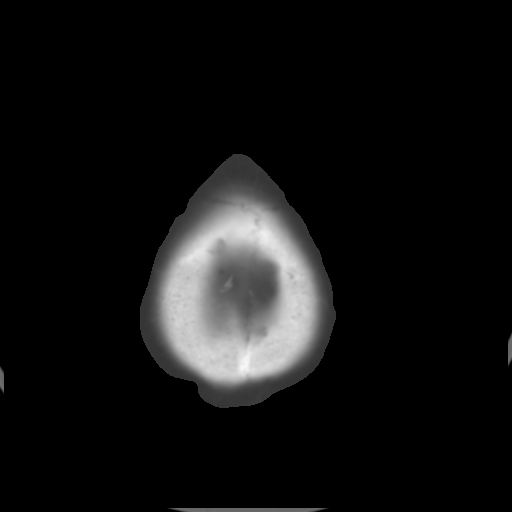

[Series 202: head w/o bone, idose (1) · axial · non-contrast · 0.45mm/px · z∈[+369,+409]mm · 3 of 31 slices shown]
[im 3/31  bone]
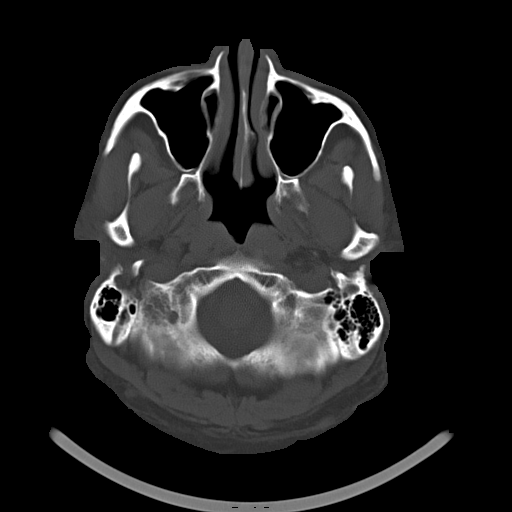
[im 7/31  bone]
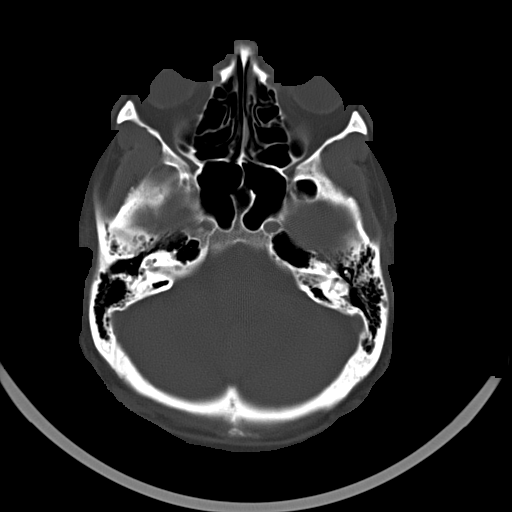
[im 11/31  bone]
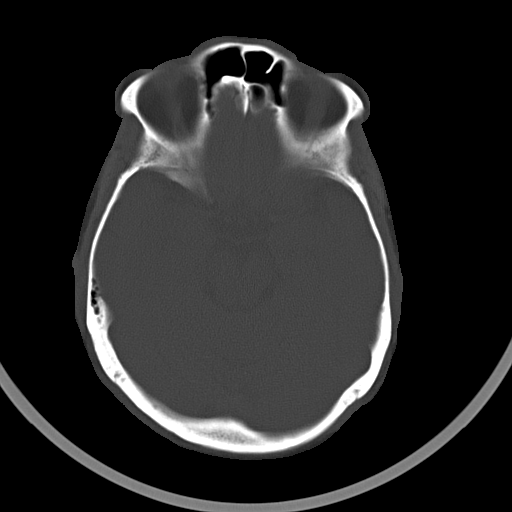

[16 of 30 positions shown; findings below may reference images not displayed]

FINDINGS: Chronic ischemic changes in the left basal ganglia. No mass effect,
midline shift, or acute hemorrhage. No mass effect, midline shift,
or acute hemorrhage.
IMPRESSION: No acute intracranial pathology.

## 2020-05-04 DEATH — deceased
